# Patient Record
Sex: Male | Born: 1960
Health system: Southern US, Community
[De-identification: ages and names within clinical notes are randomized; demographics above are authoritative.]

## PROBLEM LIST (undated history)

## (undated) DIAGNOSIS — N400 Enlarged prostate without lower urinary tract symptoms: Secondary | ICD-10-CM

## (undated) DIAGNOSIS — F32A Depression, unspecified: Secondary | ICD-10-CM

## (undated) DIAGNOSIS — K635 Polyp of colon: Secondary | ICD-10-CM

## (undated) DIAGNOSIS — F329 Major depressive disorder, single episode, unspecified: Secondary | ICD-10-CM

## (undated) DIAGNOSIS — S72009A Fracture of unspecified part of neck of unspecified femur, initial encounter for closed fracture: Secondary | ICD-10-CM

## (undated) DIAGNOSIS — I739 Peripheral vascular disease, unspecified: Secondary | ICD-10-CM

## (undated) DIAGNOSIS — I10 Essential (primary) hypertension: Secondary | ICD-10-CM

## (undated) HISTORY — DX: Essential (primary) hypertension: I10

## (undated) HISTORY — PX: EYE SURGERY: SHX253

## (undated) HISTORY — DX: Polyp of colon: K63.5

## (undated) HISTORY — PX: OTHER SURGICAL HISTORY: SHX169

---
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
## 2013-02-24 DIAGNOSIS — K635 Polyp of colon: Secondary | ICD-10-CM

## 2013-02-24 HISTORY — DX: Polyp of colon: K63.5

## 2018-07-09 ENCOUNTER — Encounter: Payer: Self-pay | Admitting: Internal Medicine

## 2018-07-09 ENCOUNTER — Ambulatory Visit: Payer: Self-pay | Admitting: Internal Medicine

## 2018-07-09 VITALS — BP 168/88 | HR 86 | Resp 12 | Ht 67.0 in | Wt 144.0 lb

## 2018-07-09 DIAGNOSIS — F32A Depression, unspecified: Secondary | ICD-10-CM

## 2018-07-09 DIAGNOSIS — M79605 Pain in left leg: Secondary | ICD-10-CM

## 2018-07-09 DIAGNOSIS — M5442 Lumbago with sciatica, left side: Secondary | ICD-10-CM

## 2018-07-09 DIAGNOSIS — M79604 Pain in right leg: Secondary | ICD-10-CM

## 2018-07-09 DIAGNOSIS — B351 Tinea unguium: Secondary | ICD-10-CM

## 2018-07-09 DIAGNOSIS — R200 Anesthesia of skin: Secondary | ICD-10-CM

## 2018-07-09 DIAGNOSIS — N319 Neuromuscular dysfunction of bladder, unspecified: Secondary | ICD-10-CM

## 2018-07-09 DIAGNOSIS — M5441 Lumbago with sciatica, right side: Secondary | ICD-10-CM

## 2018-07-09 DIAGNOSIS — R030 Elevated blood-pressure reading, without diagnosis of hypertension: Secondary | ICD-10-CM

## 2018-07-09 DIAGNOSIS — F329 Major depressive disorder, single episode, unspecified: Secondary | ICD-10-CM

## 2018-07-09 DIAGNOSIS — G8929 Other chronic pain: Secondary | ICD-10-CM

## 2018-07-09 DIAGNOSIS — Z716 Tobacco abuse counseling: Secondary | ICD-10-CM

## 2018-07-09 DIAGNOSIS — L720 Epidermal cyst: Secondary | ICD-10-CM

## 2018-07-09 MED ORDER — BUPROPION HCL ER (XL) 150 MG PO TB24: ORAL_TABLET | ORAL | 2 refills | Status: DC

## 2018-07-09 NOTE — Patient Instructions (Signed)
Can google "advance directives, [place]"  And bring up form from Secretary of State. Print and fill out Or can go to "5 wishes"  Which is also in Spanish and fill out--this costs $5--perhaps easier to use. Designate a Medical Power of Attorney to speak for you if you are unable to speak for yourself when ill or injured  
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
## 2018-07-09 NOTE — Progress Notes (Signed)
Patient ID: Jaime [name], male   DOB: Jul 26, 1961, 57 y.o.   MRN: [id number]  Social work Intern, Delena Serve assessed patient for [place] symptoms and Social determinants of Health. Jaime [name] reported that he has lost interest in daily activities, and is feeling depressed. Patient also expressed that he has been feeling tired and is  having difficulties sleeping through the night. He reported having poor appetite daily. and has been feeling bad about himself. He also stated having difficulty concentrating on things and he has been moving slow for several days a week. Denied any suicidal thoughts.   Regarding SDOH, Patient disclosed that he was homeless for a period of time, but has housing at the moment. According to Jaime [name], all other needs for SDOH are met. SWI offered counseling, patient accepted, will commence/start counseling next week.

## 2018-07-16 ENCOUNTER — Ambulatory Visit: Payer: Self-pay | Admitting: Internal Medicine

## 2018-07-16 ENCOUNTER — Encounter: Payer: Self-pay | Admitting: Internal Medicine

## 2018-07-16 VITALS — BP 150/82 | HR 84 | Resp 12 | Ht 67.0 in | Wt 142.0 lb

## 2018-07-16 DIAGNOSIS — F32A Depression, unspecified: Secondary | ICD-10-CM | POA: Insufficient documentation

## 2018-07-16 DIAGNOSIS — F329 Major depressive disorder, single episode, unspecified: Secondary | ICD-10-CM | POA: Insufficient documentation

## 2018-07-16 DIAGNOSIS — N319 Neuromuscular dysfunction of bladder, unspecified: Secondary | ICD-10-CM | POA: Insufficient documentation

## 2018-07-16 DIAGNOSIS — M5441 Lumbago with sciatica, right side: Secondary | ICD-10-CM

## 2018-07-16 DIAGNOSIS — Z125 Encounter for screening for malignant neoplasm of prostate: Secondary | ICD-10-CM

## 2018-07-16 DIAGNOSIS — G8929 Other chronic pain: Secondary | ICD-10-CM | POA: Insufficient documentation

## 2018-07-16 DIAGNOSIS — R200 Anesthesia of skin: Secondary | ICD-10-CM

## 2018-07-16 DIAGNOSIS — M5442 Lumbago with sciatica, left side: Secondary | ICD-10-CM

## 2018-07-16 DIAGNOSIS — I10 Essential (primary) hypertension: Secondary | ICD-10-CM

## 2018-07-16 DIAGNOSIS — M79605 Pain in left leg: Secondary | ICD-10-CM

## 2018-07-16 DIAGNOSIS — R030 Elevated blood-pressure reading, without diagnosis of hypertension: Secondary | ICD-10-CM | POA: Insufficient documentation

## 2018-07-16 DIAGNOSIS — Z79899 Other long term (current) drug therapy: Secondary | ICD-10-CM

## 2018-07-16 DIAGNOSIS — Z13220 Encounter for screening for lipoid disorders: Secondary | ICD-10-CM

## 2018-07-16 DIAGNOSIS — M79604 Pain in right leg: Secondary | ICD-10-CM | POA: Insufficient documentation

## 2018-07-16 DIAGNOSIS — Z716 Tobacco abuse counseling: Secondary | ICD-10-CM | POA: Insufficient documentation

## 2018-07-16 MED ORDER — LISINOPRIL 5 MG PO TABS: 5.0000 mg | ORAL_TABLET | Freq: Every day | ORAL | 11 refills | Status: DC

## 2018-07-16 NOTE — Progress Notes (Signed)
   Subjective:    Patient ID: Jaime [name], male    DOB: Feb 09, 1961, 57 y.o.   MRN: [id number]  HPI   1.  Depression:  Has an appt with Lance Coon, SW intern this Friday.  Unable to get his bupropion last week, so not following up today to see how doing on that medication.  2.  Tobacco:  Has decreased smoking to 1 pack every 5 days--before would go through a third of a pack daily.  Only taking a couple of puffs of each cigarette.  3.  Elevated BP:  Not feeling anxious today  No outpatient medications have been marked as taking for the 07/16/18 encounter (Office Visit) with Julieanne Manson, MD.    No Known Allergies  Review of Systems     Objective:   Physical Exam NAD Lungs:  CTA CV: RRR without murmur or rub.  Radial and DP pulses normal and equal        Assessment & Plan:  1.  Depression:  Encouraged to notify us if he is unable to perform treatments expected for follow up in future.  We understand difficult situations, but need him to be up front about what he can and cannot do.  2.  Hypertension:  BP remains elevated.  Start Lisinopril 5 mg daily.  He will not be able to pick up until Oct 1 when he receives a check.  Followup 1 week later.  3.  Left groin cyst and toenail onychomycosis:  Discussed needs to wait on these benign conditions until we get him evaluated and treated for his leg/back/urinary/bp/depression issues.  4.  Fasting labs today:  FLP, CBC, CMP, PSA

## 2018-07-17 LAB — CBC WITH DIFFERENTIAL/PLATELET
Basophils Absolute: 0 x10E3/uL (ref 0.0–0.2)
Basos: 1 %
EOS (ABSOLUTE): 0.1 x10E3/uL (ref 0.0–0.4)
Eos: 1 %
Hematocrit: 41.2 % (ref 37.5–51.0)
Hemoglobin: 13.9 g/dL (ref 13.0–17.7)
Immature Grans (Abs): 0 x10E3/uL (ref 0.0–0.1)
Immature Granulocytes: 0 %
Lymphocytes Absolute: 2.3 x10E3/uL (ref 0.7–3.1)
Lymphs: 38 %
MCH: 32 pg (ref 26.6–33.0)
MCHC: 33.7 g/dL (ref 31.5–35.7)
MCV: 95 fL (ref 79–97)
Monocytes Absolute: 0.7 x10E3/uL (ref 0.1–0.9)
Monocytes: 11 %
Neutrophils Absolute: 3 x10E3/uL (ref 1.4–7.0)
Neutrophils: 49 %
Platelets: 300 x10E3/uL (ref 150–450)
RBC: 4.35 x10E6/uL (ref 4.14–5.80)
RDW: 11.7 % — ABNORMAL LOW (ref 12.3–15.4)
WBC: 6.1 x10E3/uL (ref 3.4–10.8)

## 2018-07-17 LAB — COMPREHENSIVE METABOLIC PANEL WITH GFR
ALT: 53 IU/L — ABNORMAL HIGH (ref 0–44)
AST: 39 IU/L (ref 0–40)
Albumin/Globulin Ratio: 1.3 (ref 1.2–2.2)
Albumin: 4.2 g/dL (ref 3.5–5.5)
Alkaline Phosphatase: 107 IU/L (ref 39–117)
BUN/Creatinine Ratio: 7 — ABNORMAL LOW (ref 9–20)
BUN: 5 mg/dL — ABNORMAL LOW (ref 6–24)
Bilirubin Total: 0.5 mg/dL (ref 0.0–1.2)
CO2: 22 mmol/L (ref 20–29)
Calcium: 9.7 mg/dL (ref 8.7–10.2)
Chloride: 93 mmol/L — ABNORMAL LOW (ref 96–106)
Creatinine, Ser: 0.75 mg/dL — ABNORMAL LOW (ref 0.76–1.27)
GFR calc Af Amer: 119 mL/min/1.73 (ref >59)
GFR calc non Af Amer: 103 mL/min/1.73 (ref >59)
Globulin, Total: 3.3 g/dL (ref 1.5–4.5)
Glucose: 99 mg/dL (ref 65–99)
Potassium: 5.2 mmol/L (ref 3.5–5.2)
Sodium: 133 mmol/L — ABNORMAL LOW (ref 134–144)
Total Protein: 7.5 g/dL (ref 6.0–8.5)

## 2018-07-17 LAB — LIPID PANEL W/O CHOL/HDL RATIO
Cholesterol, Total: 146 mg/dL (ref 100–199)
HDL: 61 mg/dL (ref >39)
LDL Calculated: 75 mg/dL (ref 0–99)
Triglycerides: 51 mg/dL (ref 0–149)
VLDL Cholesterol Cal: 10 mg/dL (ref 5–40)

## 2018-07-17 LAB — PSA: Prostate Specific Ag, Serum: 1.4 ng/mL (ref 0.0–4.0)

## 2018-07-18 ENCOUNTER — Ambulatory Visit: Payer: Self-pay | Admitting: Licensed Clinical Social Worker

## 2018-07-18 DIAGNOSIS — F32A Depression, unspecified: Secondary | ICD-10-CM

## 2018-07-18 DIAGNOSIS — F329 Major depressive disorder, single episode, unspecified: Secondary | ICD-10-CM
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
## 2018-07-25 ENCOUNTER — Other Ambulatory Visit: Payer: Self-pay | Admitting: Licensed Clinical Social Worker

## 2018-08-05 ENCOUNTER — Encounter: Payer: Self-pay | Admitting: Internal Medicine

## 2018-08-05 ENCOUNTER — Ambulatory Visit: Payer: Self-pay | Admitting: Internal Medicine

## 2018-08-05 VITALS — BP 132/72 | HR 84 | Resp 12 | Ht 67.0 in | Wt 141.0 lb

## 2018-08-05 DIAGNOSIS — F32A Depression, unspecified: Secondary | ICD-10-CM

## 2018-08-05 DIAGNOSIS — M79605 Pain in left leg: Secondary | ICD-10-CM

## 2018-08-05 DIAGNOSIS — I10 Essential (primary) hypertension: Secondary | ICD-10-CM

## 2018-08-05 DIAGNOSIS — F329 Major depressive disorder, single episode, unspecified: Secondary | ICD-10-CM

## 2018-08-05 DIAGNOSIS — N319 Neuromuscular dysfunction of bladder, unspecified: Secondary | ICD-10-CM

## 2018-08-05 DIAGNOSIS — M79604 Pain in right leg: Secondary | ICD-10-CM

## 2018-08-05 NOTE — Progress Notes (Signed)
   Subjective:    Patient ID: Jaime [name], male    DOB: 07-07-1961, 57 y.o.   MRN: [id number]  HPI   1.  Hypertension: much improved with low dose Lisinopril.  Has a mild dry cough now.  He only has it rarely.  2. Tobacco abuse:  Down to 1 pack per week.  3.  Depression:  Has been on Bupropion for 2 weeks.  Interested in life now.  Enjoying cooking.   Still waking throughout the night.  Able to get back to sleep, but not quickly. Not eating better.  Still just not hungry--nibbles.  Not feeling down like he was previously.   Not doing much physically active due to leg discomfort.     4.  Elevated ALT:  Discussed will recheck in 2 months and if still elevated, will pursue work up.  Does not recall Hepatitis testing for Hep B and C in prison.  He does recall HIV testing that was normal.    5.  Leg and urinary issues:  Have not heard anything yet for urology, MRI, vascular studies.  Looking into when those might happen.  He is not interested in pursuing through [employer] as he is concerned about the ultimate cost.    Current Meds  Medication Sig  . buPROPion (WELLBUTRIN XL) 150 MG 24 hr tablet 1 tab by mouth every morning  . lisinopril (PRINIVIL,ZESTRIL) 5 MG tablet Take 1 tablet (5 mg total) by mouth daily.   No Known Allergies   Review of Systems     Objective:   Physical Exam NAD Smells strongly of cigarette smoke. Moves easily to exam table Lungs:  Scattered wheezes that resolve with deep breathing.  Good air movement CV:  RRR without murmur or rub.  Radial pulses normal and equal.  Mildly decreased DP pulses bilaterally Abd:  S, NT save over suprapubic area--feels need to urinate with pressure here.  No HSM or mass, + BS LE:  No edema.       Assessment & Plan:  1.  Hypertension:  Controlled on Lisinopril.  Not sure if cough related to ACE I.  To call if worsens.  2.  Depression:  Seems to have definite improvement.  CPM and follow up in 1 month.  3.   Tobacco abuse:  Encouraged to pick a day and stop.  Gave information on Quitline to get nicotine gum or lozenges to use instead of his 2 cigarette a day usage.  Bupropion suspect helps as well.  4.  Leg and urinary complaints:  We should hear from [facility] regarding Urology and MRI appts soon.  Checking to see if vascular dopple/ABI of LE can be done through [facility] as well.  If not, will hold on ordering until we see results of MRI.  If no etiology found there, then proceed with the vascular testing. Discussed he really needs to stop smoking.

## 2018-08-05 NOTE — Patient Instructions (Signed)
Free flu vaccine/covered flu vaccine (bring insurance card if you have one) at flu vaccine clinics:   [facility] card sign up in October 17th for 2 hours  between 8:30 a.m. and 11:30 a.m .  Call if you urinary symptoms or leg pain progresses.

## 2018-08-07 ENCOUNTER — Other Ambulatory Visit (INDEPENDENT_AMBULATORY_CARE_PROVIDER_SITE_OTHER): Payer: Self-pay

## 2018-08-07 DIAGNOSIS — R319 Hematuria, unspecified: Secondary | ICD-10-CM

## 2018-08-07 LAB — POCT URINALYSIS DIPSTICK
Bilirubin, UA: NEGATIVE
Glucose, UA: NEGATIVE
Ketones, UA: NEGATIVE
Nitrite, UA: NEGATIVE
Protein, UA: NEGATIVE
Spec Grav, UA: <=1.005 — AB
Urobilinogen, UA: 0.2 U/dL
pH, UA: 5

## 2018-08-09 LAB — URINE CULTURE: Organism ID, Bacteria: NO GROWTH

## 2018-09-02 ENCOUNTER — Ambulatory Visit: Payer: Self-pay | Admitting: Internal Medicine

## 2018-09-22 ENCOUNTER — Ambulatory Visit: Payer: Self-pay | Admitting: Internal Medicine

## 2018-09-22 ENCOUNTER — Encounter: Payer: Self-pay | Admitting: Internal Medicine

## 2018-09-22 VITALS — BP 140/88 | HR 94 | Resp 12 | Ht 67.0 in | Wt 148.0 lb

## 2018-09-22 DIAGNOSIS — F32A Depression, unspecified: Secondary | ICD-10-CM

## 2018-09-22 DIAGNOSIS — F329 Major depressive disorder, single episode, unspecified: Secondary | ICD-10-CM

## 2018-09-22 DIAGNOSIS — R7401 Elevation of levels of liver transaminase levels: Secondary | ICD-10-CM

## 2018-09-22 DIAGNOSIS — M79604 Pain in right leg: Secondary | ICD-10-CM

## 2018-09-22 DIAGNOSIS — N319 Neuromuscular dysfunction of bladder, unspecified: Secondary | ICD-10-CM

## 2018-09-22 DIAGNOSIS — M79605 Pain in left leg: Secondary | ICD-10-CM

## 2018-09-22 DIAGNOSIS — Z23 Encounter for immunization: Secondary | ICD-10-CM

## 2018-09-22 DIAGNOSIS — I10 Essential (primary) hypertension: Secondary | ICD-10-CM

## 2018-09-22 DIAGNOSIS — R740 Nonspecific elevation of levels of transaminase and lactic acid dehydrogenase [LDH]: Secondary | ICD-10-CM

## 2018-09-22 DIAGNOSIS — Z716 Tobacco abuse counseling: Secondary | ICD-10-CM

## 2018-09-22 MED ORDER — BUPROPION HCL ER (XL) 150 MG PO TB24: ORAL_TABLET | ORAL | 11 refills | Status: AC

## 2018-09-22 MED ORDER — TERBINAFINE HCL 250 MG PO TABS: 250.0000 mg | ORAL_TABLET | Freq: Every day | ORAL | 0 refills | Status: DC

## 2018-09-22 MED ORDER — NICOTINE POLACRILEX 2 MG MT LOZG: 2.0000 mg | LOZENGE | OROMUCOSAL | 2 refills | Status: DC | PRN

## 2018-09-22 NOTE — Progress Notes (Signed)
Subjective:    Patient ID: Jaime [name], male    DOB: [birth year], 57 y.o.   MRN: [id number]  HPI  1.  Leg numbness, particularly in posterior lower legs. MR recently of lower thoracic to sacral spine essentially normal.  No lesion to suggest cause.  With his symptoms, seems more clearly claudication as a separate problem from what seems to be a neurogenic bladder, rather than related neurologic issue. He is still waiting for the arterial studies/ABIs of legs through [facility].   Not taking ASA He continues to smoke. States down to 2-3 cigarettes daily.   He did call the quit line, but they never sent the nicotine lozenges. He called back and they told him he did not qualify for them.    2.  Neurogenic bladder:  Again, he has to sit and apply pressure to bladder area to urinate.  This seemed to come on suddenly about 1 year ago.  He does not remember a gradual development of poor urine stream or urinary frequency or nocturia.  Just suddenly developed inability to urinate without above procedure. MR showed very distended bladder without mention of prostate.  Prostate exam in September was normal.   Able to contact Dr. Yvone Neu, reading radiologist for this study later:  MR did not go down to prostate level.  3.  Depression:  Feels he is doing better.  Getting involved in things that interest him again.  Not flying off the handle as he was.   Appetite is better, cooking for his roommate Has tried to be physically active, but limited with leg pain. Continues on Bupropion XL 150 mg daily  Tolerating well.  4.  Elevated ALT:  Was a bit high in September.  Due for recheck  5.  Hypertension:  Continues on low dose Lisinopril.  No problems.  Drinking up to a 6 pack a day over the weekend and 2 beers daily during week.    6. Toenail onychomycosis/tinea pedis:  Need to recheck ALT before starting Terbinafine.     Current Meds  Medication Sig  . buPROPion (WELLBUTRIN XL) 150 MG 24 hr tablet 1 tab by mouth  every morning  . lisinopril (PRINIVIL,ZESTRIL) 5 MG tablet Take 1 tablet (5 mg total) by mouth daily.   No Known Allergies Review of Systems     Objective:   Physical Exam NAD HEENT:  PERRL, EOMI, throat without injection Neck:  Supple, No adenopathy Chest:  CTA CV:  RRR without murmur or rub.  Radial pulses normal and equal Abd:  S, NT, No HSM or mass, + BS LE:  Somewhat decreased femoral pulses bilaterally. Decreased tibial pulses in popliteal fossae bilaterally. Unable to palpate PT pulses, but does have decreased DP pulses bilaterally. Feet:  Thickened flaking skin about nails.  Toenails, especially great toenails with thickening and discoloration.       Assessment & Plan:  1.  Leg numbness:  This is likely claudication symptomatology.  Has not had his vascular studies yet through orange card.  Checking in with them to see when this might be done or if should proceed through [facility] and apply for financial aid. Needs to quit smoking. Rx for nicotine lozenges to use as needed.  2.  Neurogenic bladder:  MR did not show the prostate.  Has urology appt on the 5th.  Will send MR results.  3.  Hypertension:  Fair control.  No change to dosing of Lisinopril for now.  4.  Depression:  Much improved.  Continue bupropion for 1 year.  5.  Elevated ALT:  Encouraged decreased beer intake, though would expect AST to be higher if this is the cause. Recheck hepatic profile today.  6. Toenail onychomycosis:  Await hepatic profile.  If ALT normalized, start Terbinafine 250 mg daily for 84 day.  Discussed shoe and shower floor care.    7.  HM:  Influenza vaccine.

## 2018-09-23 LAB — HEPATIC FUNCTION PANEL
ALT: 26 IU/L (ref 0–44)
AST: 32 IU/L (ref 0–40)
Albumin: 4.4 g/dL (ref 3.5–5.5)
Alkaline Phosphatase: 83 IU/L (ref 39–117)
Bilirubin Total: 0.3 mg/dL (ref 0.0–1.2)
Bilirubin, Direct: 0.12 mg/dL (ref 0.00–0.40)
Total Protein: 7.9 g/dL (ref 6.0–8.5)

## 2018-10-17 ENCOUNTER — Other Ambulatory Visit: Payer: Self-pay

## 2018-10-17 DIAGNOSIS — Z125 Encounter for screening for malignant neoplasm of prostate: Secondary | ICD-10-CM

## 2018-10-18 LAB — PSA: Prostate Specific Ag, Serum: 1.2 ng/mL (ref 0.0–4.0)

## 2018-10-30 ENCOUNTER — Other Ambulatory Visit (HOSPITAL_COMMUNITY): Payer: Self-pay | Admitting: Urology

## 2018-10-30 DIAGNOSIS — R3121 Asymptomatic microscopic hematuria: Secondary | ICD-10-CM

## 2018-11-03 ENCOUNTER — Ambulatory Visit: Payer: Self-pay | Admitting: Internal Medicine

## 2018-11-03 ENCOUNTER — Telehealth: Payer: Self-pay | Admitting: Internal Medicine

## 2018-11-03 NOTE — Telephone Encounter (Signed)
Patient is scheduled for diagnostic for kidney and bladder.  Patient was told he was being checked for Prostate Cancer.  Patient is scheduled  for November 11, 2018 for CAT Scan and blood work at [facility].  Patient is upset and would like doctor to call him.  Patient can be called at [phone].

## 2018-11-07 NOTE — Telephone Encounter (Signed)
Spoke with patient. States they are going to do a CT Scan. Patient states he is nervous because he has never had these issues before. Patient states he wants Dr. Delrae Alfred to stay informed on all the decisions that Dr. Alvester Morin is making. Per Dr. Delrae Alfred patient is going through routine checks to rule out if there is any major issues. This is a normal process. Informed patient we would get the records and call him back if we have questions. Patient verbalized understanding.

## 2018-11-11 ENCOUNTER — Ambulatory Visit (HOSPITAL_COMMUNITY)
Admission: RE | Admit: 2018-11-11 | Discharge: 2018-11-11 | Disposition: A | Discharge status: Home / Self Care | Payer: Medicaid Other | Source: Ambulatory Visit | Attending: Urology | Admitting: Urology

## 2018-11-11 ENCOUNTER — Encounter (HOSPITAL_COMMUNITY): Payer: Self-pay

## 2018-11-11 ENCOUNTER — Other Ambulatory Visit (HOSPITAL_COMMUNITY)
Admit: 2018-11-11 | Discharge: 2018-11-11 | Disposition: A | Discharge status: Home / Self Care | Payer: Medicaid Other | Source: Ambulatory Visit | Attending: Urology | Admitting: Urology

## 2018-11-11 DIAGNOSIS — R3121 Asymptomatic microscopic hematuria: Secondary | ICD-10-CM | POA: Insufficient documentation

## 2018-11-11 LAB — CREATININE, SERUM
Creatinine, Ser: 0.79 mg/dL (ref 0.61–1.24)
GFR calc Af Amer: >60 mL/min (ref >60)
GFR calc non Af Amer: >60 mL/min (ref >60)

## 2018-11-11 LAB — BUN: BUN: 7 mg/dL (ref 6–20)

## 2018-11-11 MED ORDER — IOHEXOL 300 MG/ML  SOLN
100.0000 mL | Freq: Once | INTRAMUSCULAR | Status: AC | PRN
  Administered 2018-11-11: 100 mL via INTRAVENOUS

## 2018-11-11 MED ORDER — SODIUM CHLORIDE (PF) 0.9 % IJ SOLN
INTRAMUSCULAR | Status: AC
  Filled 2018-11-11: qty 50

## 2018-11-11 MED ORDER — SODIUM CHLORIDE 0.9 % IV SOLN
INTRAVENOUS | Status: AC
  Filled 2018-11-11: qty 250

## 2018-11-22 ENCOUNTER — Encounter: Payer: Self-pay | Admitting: Internal Medicine

## 2018-11-22 DIAGNOSIS — I10 Essential (primary) hypertension: Secondary | ICD-10-CM | POA: Insufficient documentation

## 2018-12-08 ENCOUNTER — Encounter: Payer: Self-pay | Admitting: Internal Medicine

## 2018-12-08 ENCOUNTER — Ambulatory Visit: Payer: Medicaid Other | Admitting: Internal Medicine

## 2018-12-08 VITALS — BP 152/80 | HR 94 | Resp 12 | Ht 67.0 in | Wt 146.0 lb

## 2018-12-08 DIAGNOSIS — Z716 Tobacco abuse counseling: Secondary | ICD-10-CM | POA: Diagnosis not present

## 2018-12-08 DIAGNOSIS — M79604 Pain in right leg: Secondary | ICD-10-CM

## 2018-12-08 DIAGNOSIS — Z79899 Other long term (current) drug therapy: Secondary | ICD-10-CM | POA: Diagnosis not present

## 2018-12-08 DIAGNOSIS — B351 Tinea unguium: Secondary | ICD-10-CM

## 2018-12-08 DIAGNOSIS — R200 Anesthesia of skin: Secondary | ICD-10-CM | POA: Diagnosis not present

## 2018-12-08 DIAGNOSIS — M79605 Pain in left leg: Secondary | ICD-10-CM

## 2018-12-08 MED ORDER — NICOTINE POLACRILEX 2 MG MT LOZG: 2.0000 mg | LOZENGE | OROMUCOSAL | 2 refills | Status: DC | PRN

## 2018-12-08 NOTE — Progress Notes (Signed)
    Subjective:    Patient ID: Jaime [name], male   DOB: [birth year], 58 y.o.   MRN: [id number]   HPI   1.  Underwent urologic work up and felt to have BPH as cause of urinary retention.  Feels his symptoms are better since starting Flomax.  2.  Still with low back pain and feet getting cold and numb now for past 4 weeks or so.  States the coldness and numbness extends to tips of all toes over dorsal foot.  Plantar foot not involved. He has taken his socks off to look at toes when has these symptoms and notes no color change MRI of lumbosacral spine without significant abnormality to suggest etiology of discomfort of back and LE originates there.  We were waiting for ABIs to evaluate for claudication, but had not received through [facility] in October.  He has the foot and leg complaints with walking, but more symptomatic when trying to lie down and relax.  Later, states if pushes self too much with walking, has more pain symptoms.  He now has Medicaid and so we can proceed with that order. Smokes maybe 2-3 cigarettes weekly. He has not obtained the Nicotine lozenges.   He has tried to obtain through the Quit line previously.    3.  Toenail fungus:  Cannot say where he is with Terbinafine.  Not clear what happened that he did not follow up at week 6.  States his toenails are clearing.     Current Meds  Medication Sig  . buPROPion (WELLBUTRIN XL) 150 MG 24 hr tablet 1 tab by mouth every morning  . lisinopril (PRINIVIL,ZESTRIL) 5 MG tablet Take 1 tablet (5 mg total) by mouth daily.  . tamsulosin (FLOMAX) 0.4 MG CAPS capsule Take 0.4 mg by mouth daily after supper.  . terbinafine (LAMISIL) 250 MG tablet Take 1 tablet (250 mg total) by mouth daily.   No Known Allergies   Review of Systems    Objective:   BP (!) 152/80 (BP Location: Left Arm, Patient Position: Sitting, Cuff Size: Normal)   Pulse 94   Resp 12   Ht 5\' 7"  (1.702 m)   Wt 146 lb (66.2 kg)   BMI 22.87 kg/m   Physical Exam   Smells of tobacco smoke mildly Lungs:  CTA CV:  RRR without murmur or rub.  Decreased DP and PT pulses  Feet with thickened horn like toenails, but with clearing at bases.   Assessment & Plan   1.  BPH with urinary retention:  As per Urology. Flomax helping  2.  Possible claudication with LE pain bilaterally.  MRI of L/S spine did not support L/S spine or nerve impingement as etiology.   Set up for evaluation of LE arteries.  3.  Toenail onychomycosis:  Appears to be responding to Terbinafine.  Continue to treat shower floors and shoes.  Hepatic profile.    4.  Tobacco use:  With concern more so for claudication symptoms, encouraged obtaining the nicotine lozenges and get off cigarettes/tobacco completely.

## 2018-12-09 LAB — HEPATIC FUNCTION PANEL
ALT: 20 IU/L (ref 0–44)
AST: 18 IU/L (ref 0–40)
Albumin: 4.4 g/dL (ref 3.8–4.9)
Alkaline Phosphatase: 89 IU/L (ref 39–117)
Bilirubin Total: 0.4 mg/dL (ref 0.0–1.2)
Bilirubin, Direct: 0.13 mg/dL (ref 0.00–0.40)
Total Protein: 7.8 g/dL (ref 6.0–8.5)

## 2018-12-22 ENCOUNTER — Other Ambulatory Visit: Payer: Self-pay | Admitting: Internal Medicine

## 2018-12-22 ENCOUNTER — Ambulatory Visit: Payer: Self-pay | Admitting: Internal Medicine

## 2018-12-30 ENCOUNTER — Ambulatory Visit: Payer: Self-pay | Admitting: Internal Medicine

## 2019-02-06 ENCOUNTER — Telehealth: Payer: Self-pay | Admitting: Internal Medicine

## 2019-02-06 NOTE — Telephone Encounter (Signed)
Rash on torso, neck, axillae areas.  Gets when hot out.   Has been intermittently using topical Lamisil and seems to take care of it for a while and then recurs. White spots on skin.  No definite flaking.  Does not want to come in. Discussed likely Tinea versicolor. If Lamisil seems to help, to use twice daily for 14 days at least and until lesions gone.  Also, may treat topically with Selsun Blue shampoo on affected areas--slather on moist skin for 10 minutes, then wash off 3 times weekly.  To call for appt to cover his chronic issues in a month or so.

## 2019-02-09 ENCOUNTER — Telehealth: Payer: Medicaid Other | Admitting: Internal Medicine

## 2019-02-24 ENCOUNTER — Encounter: Payer: Self-pay | Admitting: Internal Medicine

## 2019-02-24 DIAGNOSIS — K635 Polyp of colon: Secondary | ICD-10-CM

## 2019-03-20 ENCOUNTER — Telehealth: Payer: Self-pay

## 2019-03-20 NOTE — Telephone Encounter (Signed)
Patient called asking for a letter from Dr. Delrae Alfred. Patient apartment complex will allow him to have a dog if his doctor writes a note stating its for therapy. Patient states he found a dog and would like to get it tomorrow. State he needs the dog it helps to keep him calm.  To Dr. Delrae Alfred to write letter

## 2019-03-20 NOTE — Telephone Encounter (Signed)
Note written

## 2019-07-10 ENCOUNTER — Telehealth (HOSPITAL_COMMUNITY): Payer: Self-pay | Admitting: Rehabilitation

## 2019-07-10 ENCOUNTER — Other Ambulatory Visit: Payer: Self-pay

## 2019-07-10 DIAGNOSIS — M79605 Pain in left leg: Secondary | ICD-10-CM

## 2019-07-10 DIAGNOSIS — M79604 Pain in right leg: Secondary | ICD-10-CM

## 2019-07-10 DIAGNOSIS — R200 Anesthesia of skin: Secondary | ICD-10-CM

## 2019-07-10 NOTE — Telephone Encounter (Signed)
I attempted to contact this patient to schedule an ultrasound ordered by Dr. Amil Amen. The patient did not answer. I left the patient a voicemail on his mobile number requesting a call back to get this scheduled.

## 2019-07-15 ENCOUNTER — Other Ambulatory Visit: Payer: Self-pay | Admitting: Internal Medicine

## 2019-07-15 ENCOUNTER — Ambulatory Visit (HOSPITAL_COMMUNITY)
Admission: RE | Admit: 2019-07-15 | Discharge: 2019-07-15 | Disposition: A | Discharge status: Home / Self Care | Payer: Medicaid Other | Source: Ambulatory Visit | Attending: Family | Admitting: Family

## 2019-07-15 ENCOUNTER — Other Ambulatory Visit: Payer: Self-pay

## 2019-07-15 DIAGNOSIS — I739 Peripheral vascular disease, unspecified: Secondary | ICD-10-CM

## 2019-07-15 DIAGNOSIS — M79605 Pain in left leg: Secondary | ICD-10-CM | POA: Diagnosis not present

## 2019-07-15 DIAGNOSIS — M79604 Pain in right leg: Secondary | ICD-10-CM | POA: Insufficient documentation

## 2019-07-15 NOTE — Progress Notes (Signed)
Exam noted today with critical findings. Needs [facility] referral ASAP--called to clinic and message left with after hours answering service.

## 2019-07-15 NOTE — Progress Notes (Signed)
Performed bilateral ABI with TBI. Attempted to leave preliminary results with Dr. Amil Amen, left voicemail. Consulted with Dr. Scot Dock about preliminary results. Patient instructed to return home.

## 2019-07-20 ENCOUNTER — Telehealth: Payer: Self-pay | Admitting: Internal Medicine

## 2019-07-20 NOTE — Telephone Encounter (Signed)
Patient called requesting a call back from [place] to update her with new information regarding appointment that has been scheduled with a provider that she needs to be aware of. Patient did not want to provide more information when was asked the reason of the call. Patient's phone number [phone]  To Cherice to contact patient.

## 2019-07-21 NOTE — Telephone Encounter (Signed)
Spoke with patient. Appointment scheduled for 08/12/2019. Patient states he will try to hold out until then. States legs and feet hurt really bad. Spoke with Dr. Amil Amen regarding this and she said to hold on before he goes to the ER. States she will try and see if there is something she can start patient on to help with leg pain. States patient has to stop smoking. Message given to patient. States he has lozenges to help with smoking he has not started but will try them. Will wait to hear back from our office.  To Dr. Amil Amen for further directions.

## 2019-07-30 ENCOUNTER — Telehealth (INDEPENDENT_AMBULATORY_CARE_PROVIDER_SITE_OTHER): Payer: Medicaid Other | Admitting: Internal Medicine

## 2019-07-30 DIAGNOSIS — I10 Essential (primary) hypertension: Secondary | ICD-10-CM

## 2019-07-30 DIAGNOSIS — K029 Dental caries, unspecified: Secondary | ICD-10-CM | POA: Insufficient documentation

## 2019-07-30 DIAGNOSIS — I739 Peripheral vascular disease, unspecified: Secondary | ICD-10-CM | POA: Diagnosis not present

## 2019-07-30 MED ORDER — ASPIRIN EC 81 MG PO TBEC: 81.0000 mg | DELAYED_RELEASE_TABLET | Freq: Every day | ORAL | Status: AC

## 2019-07-30 NOTE — Patient Instructions (Signed)
Stop smoking.   Make goal to stop until intervention performed for arteries of legs and then continue with no smoking thereafter  Stop alcohol until intervention for legs.

## 2019-07-30 NOTE — Progress Notes (Signed)
Done

## 2019-07-30 NOTE — Progress Notes (Signed)
    Subjective:    Patient ID: Jaime [name], male   DOB: 24-Oct-1960, 58 y.o.   MRN: [id number]   HPI   1.  PAD:  Critical PAD of bilateral legs.  Patient with long history of bilateral leg pain and numbness.  Previously felt associated with low back pain, but radiologic evaluation of L/S spine without significant abnormality. Recent vascular evaluation of legs showed the critical disease. Has appt at [facility] on 08/12/2019 States his pain is doing a bit better. Has found not elevating legs above heart helps with sleep.  Toes are chronically numb as are dorsal feet, but no discoloration or breakdown of tissue of toes or feet.   Pain mainly with walking only. Continues to smoke about 5 cigarettes weekly. Does not want to use the nicotine lozenges he has --afraid of what they can do to his oral tissue. He is also drinking a 40 oz of malt liquor every other day. Not taking an ASA daily. Has not had BP checked since OV in February.   No showed appt in April.  2.  Dental:  Has 3 cavities after eval with dental clinic.   Has appt with them tomorrow.  Being evaluated for dental surgery that will lead to partial.  Waiting on approval for dental surgery. He will notify dentist of likely need for peripheral arterial procedure/surgery in near future.  3.  Hypertension:  Has not had bp checked in some time.  Continues on Lisinopril  4.  Tobacco:  Is taking Bupropion, but has not stopped tobacco. Again, not using nicotine lozenges either.  5.  Onychomycosis:  Toenails doing well since treatment with Terbinafine.  6.  BPH with urinary retention: improved with Tamsulosin.  Current Meds  Medication Sig  . buPROPion (WELLBUTRIN XL) 150 MG 24 hr tablet 1 tab by mouth every morning  . lisinopril (PRINIVIL,ZESTRIL) 5 MG tablet Take 1 tablet (5 mg total) by mouth daily.  . tamsulosin (FLOMAX) 0.4 MG CAPS capsule Take 0.4 mg by mouth daily after supper.   No Known Allergies   Review of Systems     Objective:   There were no vitals taken for this visit.  Physical Exam   Assessment & Plan   1.  PAD Stop smoking.   Encouraged him to use lozenges as less of a concern than the cigarettes. Goal to stop at least until LE procedure takes place. Start ASA 81 mg daily. Holding on Clopidigrel until seen by Vascular surgeon this close to visit. To call or go to ED if sudden increase in pain/discoloration. He will come in for fasting labs next week with bp check, including FLP, though was good last year.  2.  Dental:  To inform dental clinic of vascular disease and recommendation to get vascular issues addressed first.  3.  Hypertension:  bp check next week with fasting labs.  4.  BPH:  Controlled with Tamsulosin.  5.  Tobacco and alcohol: goal to hold both for now as above.

## 2019-07-31 ENCOUNTER — Telehealth: Payer: Self-pay

## 2019-07-31 NOTE — Telephone Encounter (Signed)
Patient roommate called asking for BP monitor prescription to be sent to the pharmacy. States she called medicaid and they will cover a BP monitor for him.  To Dr. Amil Amen for further directions.

## 2019-08-04 ENCOUNTER — Other Ambulatory Visit: Payer: Medicaid Other

## 2019-08-04 ENCOUNTER — Other Ambulatory Visit: Payer: Self-pay

## 2019-08-05 ENCOUNTER — Other Ambulatory Visit (INDEPENDENT_AMBULATORY_CARE_PROVIDER_SITE_OTHER): Payer: Medicaid Other

## 2019-08-05 VITALS — BP 136/78 | HR 88

## 2019-08-05 DIAGNOSIS — Z79899 Other long term (current) drug therapy: Secondary | ICD-10-CM

## 2019-08-05 DIAGNOSIS — I10 Essential (primary) hypertension: Secondary | ICD-10-CM

## 2019-08-05 DIAGNOSIS — Z13220 Encounter for screening for lipoid disorders: Secondary | ICD-10-CM | POA: Diagnosis not present

## 2019-08-09 LAB — COMPREHENSIVE METABOLIC PANEL
ALT: 18 IU/L (ref 0–44)
AST: 25 IU/L (ref 0–40)
Albumin/Globulin Ratio: 1.3 (ref 1.2–2.2)
Albumin: 4.1 g/dL (ref 3.8–4.9)
Alkaline Phosphatase: 122 IU/L — ABNORMAL HIGH (ref 39–117)
BUN/Creatinine Ratio: 4 — ABNORMAL LOW (ref 9–20)
BUN: 3 mg/dL — ABNORMAL LOW (ref 6–24)
Bilirubin Total: 0.3 mg/dL (ref 0.0–1.2)
CO2: 19 mmol/L — ABNORMAL LOW (ref 20–29)
Calcium: 9.7 mg/dL (ref 8.7–10.2)
Chloride: 101 mmol/L (ref 96–106)
Creatinine, Ser: 0.77 mg/dL (ref 0.76–1.27)
GFR calc Af Amer: 116 mL/min/{1.73_m2} (ref >59)
GFR calc non Af Amer: 101 mL/min/{1.73_m2} (ref >59)
Globulin, Total: 3.2 g/dL (ref 1.5–4.5)
Glucose: 108 mg/dL — ABNORMAL HIGH (ref 65–99)
Potassium: 4.8 mmol/L (ref 3.5–5.2)
Sodium: 139 mmol/L (ref 134–144)
Total Protein: 7.3 g/dL (ref 6.0–8.5)

## 2019-08-09 LAB — LIPID PANEL W/O CHOL/HDL RATIO
Cholesterol, Total: 149 mg/dL (ref 100–199)
HDL: 64 mg/dL (ref >39)
LDL Chol Calc (NIH): 74 mg/dL (ref 0–99)
Triglycerides: 48 mg/dL (ref 0–149)
VLDL Cholesterol Cal: 11 mg/dL (ref 5–40)

## 2019-08-09 LAB — CBC WITH DIFFERENTIAL/PLATELET
Basophils Absolute: 0 10*3/uL (ref 0.0–0.2)
Basos: 0 %
EOS (ABSOLUTE): 0.1 10*3/uL (ref 0.0–0.4)
Eos: 1 %
Hematocrit: 44.1 % (ref 37.5–51.0)
Hemoglobin: 14.8 g/dL (ref 13.0–17.7)
Immature Grans (Abs): 0 10*3/uL (ref 0.0–0.1)
Immature Granulocytes: 0 %
Lymphocytes Absolute: 2.4 10*3/uL (ref 0.7–3.1)
Lymphs: 47 %
MCH: 32 pg (ref 26.6–33.0)
MCHC: 33.6 g/dL (ref 31.5–35.7)
MCV: 96 fL (ref 79–97)
Monocytes Absolute: 0.5 10*3/uL (ref 0.1–0.9)
Monocytes: 10 %
Neutrophils Absolute: 2.2 10*3/uL (ref 1.4–7.0)
Neutrophils: 42 %
Platelets: 251 10*3/uL (ref 150–450)
RBC: 4.62 x10E6/uL (ref 4.14–5.80)
RDW: 11.9 % (ref 11.6–15.4)
WBC: 5.2 10*3/uL (ref 3.4–10.8)

## 2019-08-12 ENCOUNTER — Encounter: Payer: Self-pay | Admitting: Vascular Surgery

## 2019-08-12 ENCOUNTER — Ambulatory Visit (INDEPENDENT_AMBULATORY_CARE_PROVIDER_SITE_OTHER): Payer: Medicaid Other | Admitting: Vascular Surgery

## 2019-08-12 ENCOUNTER — Telehealth: Payer: Self-pay | Admitting: Internal Medicine

## 2019-08-12 ENCOUNTER — Other Ambulatory Visit: Payer: Self-pay

## 2019-08-12 ENCOUNTER — Encounter: Payer: Self-pay | Admitting: *Deleted

## 2019-08-12 ENCOUNTER — Other Ambulatory Visit: Payer: Self-pay | Admitting: *Deleted

## 2019-08-12 ENCOUNTER — Other Ambulatory Visit (HOSPITAL_COMMUNITY)
Admission: RE | Admit: 2019-08-12 | Discharge: 2019-08-12 | Disposition: A | Discharge status: Home / Self Care | Payer: Medicaid Other | Source: Ambulatory Visit | Attending: Vascular Surgery | Admitting: Vascular Surgery

## 2019-08-12 VITALS — BP 151/81 | HR 98 | Temp 97.9°F | Resp 20 | Ht 67.0 in | Wt 135.0 lb

## 2019-08-12 DIAGNOSIS — Z20828 Contact with and (suspected) exposure to other viral communicable diseases: Secondary | ICD-10-CM | POA: Insufficient documentation

## 2019-08-12 DIAGNOSIS — I739 Peripheral vascular disease, unspecified: Secondary | ICD-10-CM | POA: Diagnosis not present

## 2019-08-12 DIAGNOSIS — Z01812 Encounter for preprocedural laboratory examination: Secondary | ICD-10-CM | POA: Diagnosis present

## 2019-08-12 LAB — SARS CORONAVIRUS 2 (TAT 6-24 HRS): SARS Coronavirus 2: NEGATIVE

## 2019-08-12 MED ORDER — ATORVASTATIN CALCIUM 10 MG PO TABS: 10.0000 mg | ORAL_TABLET | Freq: Every day | ORAL | Status: AC

## 2019-08-12 NOTE — Telephone Encounter (Signed)
Saw patient subsequently
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
## 2019-08-12 NOTE — Telephone Encounter (Signed)
Patient called requesting a call back from [place] to provide update on his vascular vein appt. Pt. Got done today.  To Cherice to call back patient.

## 2019-08-12 NOTE — Telephone Encounter (Signed)
Spoke with patient just wanted to give a FYI in regards to vascular appointment today. Patient scheduled for angiogram for 08/14/2019. Patient just want to let us know.

## 2019-08-14 ENCOUNTER — Encounter (HOSPITAL_COMMUNITY): Payer: Self-pay | Admitting: Vascular Surgery

## 2019-08-14 ENCOUNTER — Other Ambulatory Visit: Payer: Self-pay | Admitting: Internal Medicine

## 2019-08-14 ENCOUNTER — Ambulatory Visit (HOSPITAL_COMMUNITY)
Admission: RE | Admit: 2019-08-14 | Discharge: 2019-08-14 | Disposition: A | Discharge status: Home / Self Care | Payer: Medicaid Other | Attending: Vascular Surgery | Admitting: Vascular Surgery

## 2019-08-14 ENCOUNTER — Other Ambulatory Visit: Payer: Self-pay | Admitting: *Deleted

## 2019-08-14 ENCOUNTER — Other Ambulatory Visit: Payer: Self-pay

## 2019-08-14 ENCOUNTER — Encounter: Payer: Self-pay | Admitting: Vascular Surgery

## 2019-08-14 ENCOUNTER — Encounter (HOSPITAL_COMMUNITY)
Admission: RE | Disposition: A | Discharge status: Home / Self Care | Payer: Self-pay | Source: Home / Self Care | Attending: Vascular Surgery

## 2019-08-14 DIAGNOSIS — M67452 Ganglion, left hip: Secondary | ICD-10-CM

## 2019-08-14 DIAGNOSIS — Z7982 Long term (current) use of aspirin: Secondary | ICD-10-CM | POA: Diagnosis not present

## 2019-08-14 DIAGNOSIS — Z79899 Other long term (current) drug therapy: Secondary | ICD-10-CM | POA: Diagnosis not present

## 2019-08-14 DIAGNOSIS — L723 Sebaceous cyst: Secondary | ICD-10-CM | POA: Diagnosis not present

## 2019-08-14 DIAGNOSIS — I70212 Atherosclerosis of native arteries of extremities with intermittent claudication, left leg: Secondary | ICD-10-CM

## 2019-08-14 DIAGNOSIS — I10 Essential (primary) hypertension: Secondary | ICD-10-CM | POA: Diagnosis not present

## 2019-08-14 DIAGNOSIS — I739 Peripheral vascular disease, unspecified: Secondary | ICD-10-CM | POA: Diagnosis present

## 2019-08-14 DIAGNOSIS — F17210 Nicotine dependence, cigarettes, uncomplicated: Secondary | ICD-10-CM | POA: Insufficient documentation

## 2019-08-14 DIAGNOSIS — I70221 Atherosclerosis of native arteries of extremities with rest pain, right leg: Secondary | ICD-10-CM

## 2019-08-14 DIAGNOSIS — M79661 Pain in right lower leg: Secondary | ICD-10-CM | POA: Diagnosis not present

## 2019-08-14 HISTORY — PX: ABDOMINAL AORTOGRAM W/LOWER EXTREMITY: CATH118223

## 2019-08-14 LAB — POCT I-STAT, CHEM 8
BUN: <3 mg/dL — ABNORMAL LOW (ref 6–20)
Calcium, Ion: 1.14 mmol/L — ABNORMAL LOW (ref 1.15–1.40)
Chloride: 98 mmol/L (ref 98–111)
Creatinine, Ser: 0.6 mg/dL — ABNORMAL LOW (ref 0.61–1.24)
Glucose, Bld: 98 mg/dL (ref 70–99)
HCT: 47 % (ref 39.0–52.0)
Hemoglobin: 16 g/dL (ref 13.0–17.0)
Potassium: 4 mmol/L (ref 3.5–5.1)
Sodium: 133 mmol/L — ABNORMAL LOW (ref 135–145)
TCO2: 25 mmol/L (ref 22–32)

## 2019-08-14 SURGERY — ABDOMINAL AORTOGRAM W/LOWER EXTREMITY
Anesthesia: LOCAL | Laterality: Bilateral

## 2019-08-14 MED ORDER — SODIUM CHLORIDE 0.9 % WEIGHT BASED INFUSION: 1.0000 mL/kg/h | INTRAVENOUS | Status: DC

## 2019-08-14 MED ORDER — HEPARIN (PORCINE) IN NACL 1000-0.9 UT/500ML-% IV SOLN
INTRAVENOUS | Status: AC
  Filled 2019-08-14: qty 1000

## 2019-08-14 MED ORDER — FENTANYL CITRATE (PF) 100 MCG/2ML IJ SOLN
INTRAMUSCULAR | Status: DC | PRN
  Administered 2019-08-14 (×2): 50 ug via INTRAVENOUS

## 2019-08-14 MED ORDER — LIDOCAINE HCL (PF) 1 % IJ SOLN
INTRAMUSCULAR | Status: AC
  Filled 2019-08-14: qty 30

## 2019-08-14 MED ORDER — SODIUM CHLORIDE 0.9 % IV SOLN
INTRAVENOUS | Status: DC
  Administered 2019-08-14: 06:00:00 via INTRAVENOUS

## 2019-08-14 MED ORDER — ATORVASTATIN CALCIUM 10 MG PO TABS: 10.0000 mg | ORAL_TABLET | Freq: Every day | ORAL | 11 refills | Status: DC

## 2019-08-14 MED ORDER — HEPARIN (PORCINE) IN NACL 1000-0.9 UT/500ML-% IV SOLN
INTRAVENOUS | Status: DC | PRN
  Administered 2019-08-14 (×2): 500 mL

## 2019-08-14 MED ORDER — IODIXANOL 320 MG/ML IV SOLN
INTRAVENOUS | Status: DC | PRN
  Administered 2019-08-14: 157 mL via INTRA_ARTERIAL

## 2019-08-14 MED ORDER — LIDOCAINE HCL (PF) 1 % IJ SOLN
INTRAMUSCULAR | Status: DC | PRN
  Administered 2019-08-14 (×2): 20 mL

## 2019-08-14 MED ORDER — FENTANYL CITRATE (PF) 100 MCG/2ML IJ SOLN
INTRAMUSCULAR | Status: AC
  Filled 2019-08-14: qty 2

## 2019-08-14 MED ORDER — MIDAZOLAM HCL 2 MG/2ML IJ SOLN
INTRAMUSCULAR | Status: DC | PRN
  Administered 2019-08-14 (×2): 1 mg via INTRAVENOUS

## 2019-08-14 MED ORDER — MIDAZOLAM HCL 2 MG/2ML IJ SOLN
INTRAMUSCULAR | Status: AC
  Filled 2019-08-14: qty 2

## 2019-08-14 SURGICAL SUPPLY — 12 items
CATH ANGIO 5F BER2 65CM (CATHETERS) ×1 IMPLANT
CATH ANGIO 5F PIGTAIL 65CM (CATHETERS) ×1 IMPLANT
KIT MICROPUNCTURE NIT STIFF (SHEATH) ×1 IMPLANT
KIT PV (KITS) ×2 IMPLANT
SHEATH PINNACLE 5F 10CM (SHEATH) ×1 IMPLANT
SHEATH PROBE COVER 6X72 (BAG) ×1 IMPLANT
STOPCOCK MORSE 400PSI 3WAY (MISCELLANEOUS) ×1 IMPLANT
SYR MEDRAD MARK 7 150ML (SYRINGE) ×2 IMPLANT
TRANSDUCER W/STOPCOCK (MISCELLANEOUS) ×2 IMPLANT
TRAY PV CATH (CUSTOM PROCEDURE TRAY) ×2 IMPLANT
TUBING CIL FLEX 10 FLL-RA (TUBING) ×1 IMPLANT
WIRE BENTSON .035X145CM (WIRE) ×1 IMPLANT

## 2019-08-14 NOTE — Interval H&P Note (Signed)
History and Physical Interval Note:  08/14/2019 7:23 AM  Jaime [name]  has presented today for surgery, with the diagnosis of PVD.  The various methods of treatment have been discussed with the patient and family. After consideration of risks, benefits and other options for treatment, the patient has consented to  Procedure(s): ABDOMINAL AORTOGRAM W/LOWER EXTREMITY (Bilateral) as a surgical intervention.  The patient's history has been reviewed, patient examined, no change in status, stable for surgery.  I have reviewed the patient's chart and labs.  Questions were answered to the patient's satisfaction.     [name]

## 2019-08-14 NOTE — Progress Notes (Signed)
Site area: rt groin fa sheath Site Prior to Removal:  Level 0 Pressure Applied For: 20 minutes Manual:   yes Patient Status During Pull:  stable Post Pull Site:  Level 0 Post Pull Instructions Given:  yes Post Pull Pulses Present: rt pt dopplered Dressing Applied:  Gauze and tegaderm Bedrest begins @ 0905 Comments:   

## 2019-08-14 NOTE — Op Note (Addendum)
PATIENT: Jaime [name]      MRN: [id number] DOB: 05-16-1961    DATE OF PROCEDURE: 08/14/2019  INDICATIONS:    Jaime [name] is a 58 y.o. male who presents with bilateral lower extremity claudication and rest pain on the right consistent with critical limb ischemia.  He had evidence of multilevel arterial occlusive disease on exam.  Of note, he has a large sebaceous cyst in his left groin as documented below.     PROCEDURE:    Conscious sedation Ultrasound-guided access to the right common femoral artery Aortogram and bilateral iliac arteriogram with bilateral lower extremity runoff  SURGEON: Judeth Cornfield. Scot Dock, MD, FACS  ANESTHESIA: Local with sedation  EBL: Minimal  TECHNIQUE: The patient was brought to the peripheral vascular lab and was sedated.  The period of conscious sedation was 40 minutes.  During that time period, I was present face-to-face 100% of the time.  The patient was administered 2 mg of Versed and 100 mcg of fentanyl. The patient's heart rate, blood pressure, and oxygen saturation were monitored by the nurse continuously during the procedure.  Both groins were prepped and draped in the usual sterile fashion.  Under ultrasound guidance, after the skin was anesthetized, I cannulated the right common femoral artery with a micropuncture needle and a micropuncture sheath was introduced over a wire.  This was exchanged for a 5 [place] sheath over a Bentson wire.  By ultrasound the femoral artery was patent. A real-time image was obtained and sent to the server.  Using a Berenstein 2 catheter I was able to get the wire up past the plaque in the right common iliac artery.  A pigtail catheter was positioned at the L1 vertebral body and flush aortogram obtained.  The catheter was positioned above the aortic bifurcation and oblique iliac projections were obtained.  I then advanced the catheter and a lateral projection was obtained.  The catheter was in position above the aortic  bifurcation and bilateral lower extremity runoff films were obtained.  FINDINGS:   1.  By ultrasound there is extensive plaque in both common femoral arteries. 2.  There are single renal arteries bilaterally with no significant renal artery stenosis identified.  The celiac axis and superior mesenteric artery appear to have a common origin.  The IMA is patent. 3.  The infrarenal aorta is patent although there is an eccentric plaque in the distal aorta. 4.  On the right side, which is the more symptomatic side, there is a bulky calcific plaque producing a 90% stenosis in the right common iliac artery.  At the proximal right external iliac artery there was a calcific plaque producing an approximately 80% narrowing.  There is a bulky calcific plaque in the right common femoral artery.  The superficial femoral artery has moderate diffuse disease throughout and the popliteal artery is severely diseased especially above the knee.  There is three-vessel runoff on the right via the anterior tibial, posterior tibial, and peroneal arteries. 5.  On the left side there is plaque in the common iliac artery and proximal external iliac artery.  The external iliac artery is occluded extending down into the common femoral artery.  There is reconstitution of the deep femoral artery and superficial femoral artery.  There is occlusion of the distal superficial femoral artery and disease in the popliteal artery.  There is three-vessel runoff on the left via the anterior tibial, posterior tibial, and peroneal arteries.  CLINICAL NOTE: This patient will need bilateral femoral endarterectomies and aortofemoral  bypass grafting.  However he has a sebaceous cyst in the left groin and this is going to have to be addressed preoperatively given the risk of infection if this is not addressed first.  I have started him on a statin.  He is on aspirin.  I will arrange for him to see general surgery for of evaluation to address the sebaceous  cyst.  Waverly Ferrari, MD, FACS [facility]  DATE OF DICTATION:   08/14/2019

## 2019-08-14 NOTE — Discharge Instructions (Signed)

## 2019-08-18 ENCOUNTER — Encounter (HOSPITAL_COMMUNITY): Payer: Self-pay | Admitting: Vascular Surgery

## 2019-08-28 ENCOUNTER — Other Ambulatory Visit: Payer: Self-pay

## 2019-08-28 MED ORDER — ATORVASTATIN CALCIUM 10 MG PO TABS: 10.0000 mg | ORAL_TABLET | Freq: Every day | ORAL | 11 refills | Status: DC

## 2019-08-31 ENCOUNTER — Telehealth: Payer: Self-pay | Admitting: Internal Medicine

## 2019-08-31 NOTE — Telephone Encounter (Signed)
noted 

## 2019-08-31 NOTE — Telephone Encounter (Signed)
Patient called to informed Jaime [name] was told by the pharmacy his Cholesterol medication can not be filled until 09/06/2019. Nicky asked pt. Is something has to be done from [place]; he stated is only for Jaime [name] to be aware .   FYI.Jaime [name]

## 2019-09-04 ENCOUNTER — Ambulatory Visit: Payer: Self-pay | Admitting: Surgery
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
## 2019-09-08 ENCOUNTER — Telehealth: Payer: Self-pay | Admitting: Licensed Clinical Social Worker

## 2019-09-08 NOTE — Telephone Encounter (Signed)
Spoke with patient. Informed to continue taking it and just take atorvastatin once a day. Patient verbalized understanding.

## 2019-09-08 NOTE — Progress Notes (Signed)
[place] ([place]), [place] - [address] [place] ([place]) [place] [address] Phone: [phone] Fax: [phone]      Your procedure is scheduled on Tuesday 09/15/2019.  Report to [facility] Main Entrance "A" at 05:30 A.M., and check in at the Admitting office.  Call this number if you have problems the morning of surgery:  [phone]   Call [phone] if you have any questions prior to your surgery date Monday-Friday 8am-4pm    Remember:  Do not eat or drink after midnight the night before your surgery     Take these medicines the morning of surgery with A SIP OF WATER: Atorvastatin (Lipitor) Bupropion (Wellbutrin XL) Tamsulosin (Flomax)  As of today, STOP taking any Aleve, Naproxen, Ibuprofen, Motrin, Advil, Goody's, BC's, all herbal medications, fish oil, and all vitamins.  Follow your surgeon's instructions on when to stop Aspirin.  If no instructions were given by your surgeon then you will need to call the office to get those instructions.      The Morning of Surgery  Do not wear jewelry.  Do not wear lotions, powders, colognes, or deodorant  Men may shave face and neck.  Do not bring valuables to the hospital.  [facility] is not responsible for any belongings or valuables.  If you are a smoker, DO NOT Smoke 24 hours prior to surgery  If you wear a CPAP at night please bring your mask, tubing, and machine the morning of surgery   Remember that you must have someone to transport you home after your surgery, and remain with you for 24 hours if you are discharged the same day.   Please bring cases for contacts, glasses, hearing aids, dentures or bridgework because it cannot be worn into surgery.    Leave your suitcase in the car.  After surgery it may be brought to your room.  For patients admitted to the hospital, discharge time will be determined by your treatment team.  Patients discharged the day of  surgery will not be allowed to drive home.    Special instructions:   [place]- Preparing For Surgery  Before surgery, you can play an important role. Because skin is not sterile, your skin needs to be as free of germs as possible. You can reduce the number of germs on your skin by washing with CHG (chlorahexidine gluconate) Soap before surgery.  CHG is an antiseptic cleaner which kills germs and bonds with the skin to continue killing germs even after washing.    Oral Hygiene is also important to reduce your risk of infection.  Remember - BRUSH YOUR TEETH THE MORNING OF SURGERY WITH YOUR REGULAR TOOTHPASTE  Please do not use if you have an allergy to CHG or antibacterial soaps. If your skin becomes reddened/irritated stop using the CHG.  Do not shave (including legs and underarms) for at least 48 hours prior to first CHG shower. It is OK to shave your face.  Please follow these instructions carefully.   1. Shower the NIGHT BEFORE SURGERY and the MORNING OF SURGERY with CHG Soap.   2. If you chose to wash your hair, wash your hair first as usual with your normal shampoo.  3. After you shampoo, rinse your hair and body thoroughly to remove the shampoo.  4. Use CHG as you would any other liquid soap. You can apply CHG directly to the skin and wash gently with a scrungie or a clean washcloth.  5. Apply the CHG Soap to your body ONLY FROM THE NECK DOWN.  Do not use on open wounds or open sores. Avoid contact with your eyes, ears, mouth and genitals (private parts). Wash Face and genitals (private parts)  with your normal soap.   6. Wash thoroughly, paying special attention to the area where your surgery will be performed.  7. Thoroughly rinse your body with warm water from the neck down.  8. DO NOT shower/wash with your normal soap after using and rinsing off the CHG Soap.  9. Pat yourself dry with a CLEAN TOWEL.  10. Wear CLEAN PAJAMAS to bed the night before surgery, wear  comfortable clothes the morning of surgery  11. Place CLEAN SHEETS on your bed the night of your first shower and DO NOT SLEEP WITH PETS.    Day of Surgery:  Please shower the morning of surgery with the CHG soap Do not apply any deodorants/lotions. Please wear clean clothes to the hospital/surgery center.   Remember to brush your teeth WITH YOUR REGULAR TOOTHPASTE.   Please read over the following fact sheets that you were given.

## 2019-09-08 NOTE — Telephone Encounter (Signed)
Patient called to speak with CMA. CMA was unavailable. Patient reports Dr. Bobette Mo and Dr. Amil Amen prescribed him the same medication and he would like to know what he is "supposed to do with that"?  SWK took patient's name and phone number and let him know CMA would call when she is available.

## 2019-09-09 ENCOUNTER — Encounter (HOSPITAL_COMMUNITY): Payer: Self-pay

## 2019-09-09 ENCOUNTER — Encounter (HOSPITAL_COMMUNITY)
Admission: RE | Admit: 2019-09-09 | Discharge: 2019-09-09 | Disposition: A | Discharge status: Home / Self Care | Payer: Medicaid Other | Source: Ambulatory Visit | Attending: Surgery | Admitting: Surgery

## 2019-09-09 ENCOUNTER — Other Ambulatory Visit: Payer: Self-pay

## 2019-09-09 DIAGNOSIS — F329 Major depressive disorder, single episode, unspecified: Secondary | ICD-10-CM | POA: Diagnosis not present

## 2019-09-09 DIAGNOSIS — Z7982 Long term (current) use of aspirin: Secondary | ICD-10-CM | POA: Insufficient documentation

## 2019-09-09 DIAGNOSIS — I739 Peripheral vascular disease, unspecified: Secondary | ICD-10-CM | POA: Insufficient documentation

## 2019-09-09 DIAGNOSIS — L729 Follicular cyst of the skin and subcutaneous tissue, unspecified: Secondary | ICD-10-CM | POA: Diagnosis not present

## 2019-09-09 DIAGNOSIS — Z8673 Personal history of transient ischemic attack (TIA), and cerebral infarction without residual deficits: Secondary | ICD-10-CM | POA: Diagnosis not present

## 2019-09-09 DIAGNOSIS — Z01812 Encounter for preprocedural laboratory examination: Secondary | ICD-10-CM | POA: Diagnosis present

## 2019-09-09 DIAGNOSIS — Z79899 Other long term (current) drug therapy: Secondary | ICD-10-CM | POA: Insufficient documentation

## 2019-09-09 DIAGNOSIS — I10 Essential (primary) hypertension: Secondary | ICD-10-CM | POA: Insufficient documentation

## 2019-09-09 DIAGNOSIS — Z87891 Personal history of nicotine dependence: Secondary | ICD-10-CM | POA: Diagnosis not present

## 2019-09-09 HISTORY — DX: Benign prostatic hyperplasia without lower urinary tract symptoms: N40.0

## 2019-09-09 HISTORY — DX: Depression, unspecified: F32.A

## 2019-09-09 HISTORY — DX: Peripheral vascular disease, unspecified: I73.9

## 2019-09-09 HISTORY — DX: Fracture of unspecified part of neck of unspecified femur, initial encounter for closed fracture: S72.009A

## 2019-09-09 LAB — BASIC METABOLIC PANEL
Anion gap: 11 (ref 5–15)
BUN: <5 mg/dL — ABNORMAL LOW (ref 6–20)
CO2: 24 mmol/L (ref 22–32)
Calcium: 9.1 mg/dL (ref 8.9–10.3)
Chloride: 95 mmol/L — ABNORMAL LOW (ref 98–111)
Creatinine, Ser: 0.74 mg/dL (ref 0.61–1.24)
GFR calc Af Amer: >60 mL/min (ref >60)
GFR calc non Af Amer: >60 mL/min (ref >60)
Glucose, Bld: 72 mg/dL (ref 70–99)
Potassium: 4.1 mmol/L (ref 3.5–5.1)
Sodium: 130 mmol/L — ABNORMAL LOW (ref 135–145)

## 2019-09-09 LAB — CBC
HCT: 46 % (ref 39.0–52.0)
Hemoglobin: 15.4 g/dL (ref 13.0–17.0)
MCH: 32 pg (ref 26.0–34.0)
MCHC: 33.5 g/dL (ref 30.0–36.0)
MCV: 95.4 fL (ref 80.0–100.0)
Platelets: 256 10*3/uL (ref 150–400)
RBC: 4.82 MIL/uL (ref 4.22–5.81)
RDW: 11.5 % (ref 11.5–15.5)
WBC: 6 10*3/uL (ref 4.0–10.5)
nRBC: 0 % (ref 0.0–0.2)

## 2019-09-09 NOTE — Anesthesia Preprocedure Evaluation (Addendum)
Anesthesia Evaluation  Patient identified by MRN, date of birth, ID band Patient awake    Reviewed: Allergy & Precautions, H&P , NPO status , Patient's Chart, lab work & pertinent test results  Airway Mallampati: II  TM Distance: >3 FB Neck ROM: Full    Dental no notable dental hx. (+) Teeth Intact, Dental Advisory Given   Pulmonary neg pulmonary ROS, Current Smoker,    Pulmonary exam normal breath sounds clear to auscultation       Cardiovascular Exercise Tolerance: Good hypertension, + Peripheral Vascular Disease  negative cardio ROS Normal cardiovascular exam Rhythm:Regular Rate:Normal     Neuro/Psych PSYCHIATRIC DISORDERS Depression  Neuromuscular disease negative neurological ROS  negative psych ROS   GI/Hepatic negative GI ROS, Neg liver ROS,   Endo/Other  negative endocrine ROS  Renal/GU negative Renal ROS  negative genitourinary   Musculoskeletal negative musculoskeletal ROS (+)   Abdominal   Peds negative pediatric ROS (+)  Hematology negative hematology ROS (+)   Anesthesia Other Findings CLINICAL NOTE: This patient will need bilateral femoral endarterectomies and aortofemoral bypass grafting.  However he has a sebaceous cyst in the left groin and this is going to have to be addressed preoperatively given the risk of infection if this is not addressed first.  Reproductive/Obstetrics negative OB ROS                           Anesthesia Physical Anesthesia Plan  ASA: III  Anesthesia Plan: MAC   Post-op Pain Management:    Induction: Intravenous  PONV Risk Score and Plan:   Airway Management Planned: Mask, Natural Airway, Nasal Cannula and Simple Face Mask  Additional Equipment:   Intra-op Plan:   Post-operative Plan:   Informed Consent: I have reviewed the patients History and Physical, chart, labs and discussed the procedure including the risks, benefits and  alternatives for the proposed anesthesia with the patient or authorized representative who has indicated his/her understanding and acceptance.       Plan Discussed with: CRNA and Anesthesiologist  Anesthesia Plan Comments: (PAT note written 09/09/2019 by Myra Gianotti, PA-C. )       Anesthesia Quick Evaluation
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
## 2019-09-09 NOTE — Progress Notes (Signed)
PCP - Dr. Mack Hook Cardiologist - denies Vascular - Dr. [name]  PPM/ICD - denies Device Orders - N/A Rep Notified - N/A  Chest x-ray - denies EKG - 08/14/2019 Stress Test - per patient "about 8-9 years ago for a physical exam at some clinic in [place]" ECHO - denies Cardiac Cath - denies  Sleep Study - denies CPAP - N/A  Blood Thinner Instructions: N/A Aspirin Instructions: Surgeon's office number given, patient instructed to call today and find out pre-op instructions for aspirin, patient verbalized understanding  ERAS Protcol - No PRE-SURGERY Ensure or G2-  N/A  COVID TEST- Scheduled for Friday, 09/11/2019. Patient verbalized understanding of self-quarantine instruction, patient appointment place and time.  Anesthesia review: No  Patient denies shortness of breath, fever, cough and chest pain at PAT appointment  All instructions explained to the patient, with a verbal understanding of the material. Patient agrees to go over the instructions while at home for a better understanding. Patient also instructed to self quarantine after being tested for COVID-19. The opportunity to ask questions was provided.

## 2019-09-11 ENCOUNTER — Other Ambulatory Visit (HOSPITAL_COMMUNITY)
Admission: RE | Admit: 2019-09-11 | Discharge: 2019-09-11 | Disposition: A | Discharge status: Home / Self Care | Payer: Medicaid Other | Source: Ambulatory Visit | Attending: Surgery | Admitting: Surgery

## 2019-09-11 ENCOUNTER — Other Ambulatory Visit (HOSPITAL_COMMUNITY): Payer: Medicaid Other

## 2019-09-11 DIAGNOSIS — Z01812 Encounter for preprocedural laboratory examination: Secondary | ICD-10-CM | POA: Diagnosis present

## 2019-09-11 DIAGNOSIS — Z20828 Contact with and (suspected) exposure to other viral communicable diseases: Secondary | ICD-10-CM | POA: Insufficient documentation

## 2019-09-13 LAB — NOVEL CORONAVIRUS, NAA (HOSP ORDER, SEND-OUT TO REF LAB; TAT 18-24 HRS): SARS-CoV-2, NAA: NOT DETECTED

## 2019-09-15 ENCOUNTER — Ambulatory Visit (HOSPITAL_COMMUNITY)
Admission: RE | Admit: 2019-09-15 | Discharge: 2019-09-15 | Disposition: A | Discharge status: Home / Self Care | Payer: Medicaid Other | Attending: Surgery | Admitting: Surgery

## 2019-09-15 ENCOUNTER — Other Ambulatory Visit: Payer: Self-pay

## 2019-09-15 ENCOUNTER — Encounter (HOSPITAL_COMMUNITY)
Admission: RE | Disposition: A | Discharge status: Home / Self Care | Payer: Self-pay | Source: Home / Self Care | Attending: Surgery

## 2019-09-15 ENCOUNTER — Ambulatory Visit (HOSPITAL_COMMUNITY): Payer: Medicaid Other | Admitting: Vascular Surgery

## 2019-09-15 ENCOUNTER — Encounter (HOSPITAL_COMMUNITY): Payer: Self-pay

## 2019-09-15 ENCOUNTER — Ambulatory Visit (HOSPITAL_COMMUNITY): Payer: Medicaid Other | Admitting: Certified Registered Nurse Anesthetist

## 2019-09-15 DIAGNOSIS — I739 Peripheral vascular disease, unspecified: Secondary | ICD-10-CM | POA: Diagnosis not present

## 2019-09-15 DIAGNOSIS — L723 Sebaceous cyst: Secondary | ICD-10-CM | POA: Diagnosis present

## 2019-09-15 DIAGNOSIS — Z8489 Family history of other specified conditions: Secondary | ICD-10-CM | POA: Diagnosis not present

## 2019-09-15 DIAGNOSIS — I10 Essential (primary) hypertension: Secondary | ICD-10-CM | POA: Diagnosis not present

## 2019-09-15 DIAGNOSIS — F17210 Nicotine dependence, cigarettes, uncomplicated: Secondary | ICD-10-CM | POA: Diagnosis not present

## 2019-09-15 DIAGNOSIS — L720 Epidermal cyst: Secondary | ICD-10-CM | POA: Insufficient documentation

## 2019-09-15 DIAGNOSIS — Z7982 Long term (current) use of aspirin: Secondary | ICD-10-CM | POA: Diagnosis not present

## 2019-09-15 DIAGNOSIS — Z86010 Personal history of colonic polyps: Secondary | ICD-10-CM | POA: Insufficient documentation

## 2019-09-15 DIAGNOSIS — Z79899 Other long term (current) drug therapy: Secondary | ICD-10-CM | POA: Diagnosis not present

## 2019-09-15 DIAGNOSIS — Z8249 Family history of ischemic heart disease and other diseases of the circulatory system: Secondary | ICD-10-CM | POA: Diagnosis not present

## 2019-09-15 HISTORY — PX: MASS EXCISION: SHX2000

## 2019-09-15 SURGERY — EXCISION MASS
Anesthesia: Monitor Anesthesia Care | Site: Groin | Laterality: Left

## 2019-09-15 MED ORDER — ONDANSETRON HCL 4 MG/2ML IJ SOLN
INTRAMUSCULAR | Status: AC
  Filled 2019-09-15: qty 2

## 2019-09-15 MED ORDER — ACETAMINOPHEN 160 MG/5ML PO SOLN: 325.0000 mg | ORAL | Status: DC | PRN

## 2019-09-15 MED ORDER — LIDOCAINE 2% (20 MG/ML) 5 ML SYRINGE
INTRAMUSCULAR | Status: AC
  Filled 2019-09-15: qty 5

## 2019-09-15 MED ORDER — TRAMADOL HCL 50 MG PO TABS: 50.0000 mg | ORAL_TABLET | Freq: Four times a day (QID) | ORAL | 0 refills | Status: DC | PRN

## 2019-09-15 MED ORDER — MIDAZOLAM HCL 2 MG/2ML IJ SOLN
INTRAMUSCULAR | Status: AC
  Filled 2019-09-15: qty 2

## 2019-09-15 MED ORDER — SODIUM CHLORIDE 0.9 % IV SOLN: 250.0000 mL | INTRAVENOUS | Status: DC | PRN

## 2019-09-15 MED ORDER — ACETAMINOPHEN 325 MG PO TABS: 650.0000 mg | ORAL_TABLET | ORAL | Status: DC | PRN

## 2019-09-15 MED ORDER — SODIUM CHLORIDE 0.9% FLUSH: 3.0000 mL | INTRAVENOUS | Status: DC | PRN

## 2019-09-15 MED ORDER — ACETAMINOPHEN 325 MG PO TABS: 325.0000 mg | ORAL_TABLET | ORAL | Status: DC | PRN

## 2019-09-15 MED ORDER — ONDANSETRON HCL 4 MG/2ML IJ SOLN
INTRAMUSCULAR | Status: DC | PRN
  Administered 2019-09-15: 4 mg via INTRAVENOUS

## 2019-09-15 MED ORDER — OXYCODONE HCL 5 MG PO TABS: 5.0000 mg | ORAL_TABLET | ORAL | Status: DC | PRN

## 2019-09-15 MED ORDER — ACETAMINOPHEN 650 MG RE SUPP: 650.0000 mg | RECTAL | Status: DC | PRN

## 2019-09-15 MED ORDER — SODIUM CHLORIDE 0.9% FLUSH: 3.0000 mL | Freq: Two times a day (BID) | INTRAVENOUS | Status: DC

## 2019-09-15 MED ORDER — LACTATED RINGERS IV SOLN
INTRAVENOUS | Status: DC | PRN
  Administered 2019-09-15: 07:00:00 via INTRAVENOUS

## 2019-09-15 MED ORDER — FENTANYL CITRATE (PF) 100 MCG/2ML IJ SOLN
INTRAMUSCULAR | Status: DC | PRN
  Administered 2019-09-15 (×2): 50 ug via INTRAVENOUS

## 2019-09-15 MED ORDER — CHLORHEXIDINE GLUCONATE CLOTH 2 % EX PADS: 6.0000 | MEDICATED_PAD | Freq: Once | CUTANEOUS | Status: DC

## 2019-09-15 MED ORDER — PROPOFOL 10 MG/ML IV BOLUS
INTRAVENOUS | Status: AC
  Filled 2019-09-15: qty 20

## 2019-09-15 MED ORDER — FENTANYL CITRATE (PF) 100 MCG/2ML IJ SOLN: 25.0000 ug | INTRAMUSCULAR | Status: DC | PRN

## 2019-09-15 MED ORDER — FENTANYL CITRATE (PF) 250 MCG/5ML IJ SOLN
INTRAMUSCULAR | Status: AC
  Filled 2019-09-15: qty 5

## 2019-09-15 MED ORDER — STERILE WATER FOR IRRIGATION IR SOLN
Status: DC | PRN
  Administered 2019-09-15: 1000 mL

## 2019-09-15 MED ORDER — OXYCODONE HCL 5 MG/5ML PO SOLN: 5.0000 mg | Freq: Once | ORAL | Status: DC | PRN

## 2019-09-15 MED ORDER — ACETAMINOPHEN 500 MG PO TABS
1000.0000 mg | ORAL_TABLET | ORAL | Status: AC
  Administered 2019-09-15: 1000 mg via ORAL
  Filled 2019-09-15: qty 2

## 2019-09-15 MED ORDER — MIDAZOLAM HCL 5 MG/5ML IJ SOLN
INTRAMUSCULAR | Status: DC | PRN
  Administered 2019-09-15: 2 mg via INTRAVENOUS

## 2019-09-15 MED ORDER — 0.9 % SODIUM CHLORIDE (POUR BTL) OPTIME
TOPICAL | Status: DC | PRN
  Administered 2019-09-15: 1000 mL

## 2019-09-15 MED ORDER — MEPERIDINE HCL 25 MG/ML IJ SOLN: 6.2500 mg | INTRAMUSCULAR | Status: DC | PRN

## 2019-09-15 MED ORDER — BUPIVACAINE-EPINEPHRINE 0.5% -1:200000 IJ SOLN
INTRAMUSCULAR | Status: AC
  Filled 2019-09-15: qty 1

## 2019-09-15 MED ORDER — BUPIVACAINE-EPINEPHRINE (PF) 0.5% -1:200000 IJ SOLN
INTRAMUSCULAR | Status: DC | PRN
  Administered 2019-09-15: 7 mL

## 2019-09-15 MED ORDER — CEFAZOLIN SODIUM-DEXTROSE 2-4 GM/100ML-% IV SOLN
2.0000 g | INTRAVENOUS | Status: AC
  Administered 2019-09-15: 2 g via INTRAVENOUS
  Filled 2019-09-15: qty 100

## 2019-09-15 MED ORDER — PROPOFOL 500 MG/50ML IV EMUL
INTRAVENOUS | Status: DC | PRN
  Administered 2019-09-15: 100 ug/kg/min via INTRAVENOUS

## 2019-09-15 MED ORDER — OXYCODONE HCL 5 MG PO TABS: 5.0000 mg | ORAL_TABLET | Freq: Once | ORAL | Status: DC | PRN

## 2019-09-15 MED ORDER — ONDANSETRON HCL 4 MG/2ML IJ SOLN: 4.0000 mg | Freq: Once | INTRAMUSCULAR | Status: DC | PRN

## 2019-09-15 SURGICAL SUPPLY — 29 items
CANISTER SUCT 3000ML PPV (MISCELLANEOUS) IMPLANT
COVER SURGICAL LIGHT HANDLE (MISCELLANEOUS) ×3 IMPLANT
COVER WAND RF STERILE (DRAPES) IMPLANT
DECANTER SPIKE VIAL GLASS SM (MISCELLANEOUS) IMPLANT
DERMABOND ADVANCED (GAUZE/BANDAGES/DRESSINGS) ×2
DERMABOND ADVANCED .7 DNX12 (GAUZE/BANDAGES/DRESSINGS) ×1 IMPLANT
DRAPE LAPAROSCOPIC ABDOMINAL (DRAPES) IMPLANT
DRAPE LAPAROTOMY 100X72 PEDS (DRAPES) ×3 IMPLANT
ELECT REM PT RETURN 9FT ADLT (ELECTROSURGICAL) ×3
ELECTRODE REM PT RTRN 9FT ADLT (ELECTROSURGICAL) ×1 IMPLANT
GAUZE SPONGE 4X4 12PLY STRL (GAUZE/BANDAGES/DRESSINGS) IMPLANT
GLOVE BIO SURGEON STRL SZ 6 (GLOVE) ×3 IMPLANT
GLOVE INDICATOR 6.5 STRL GRN (GLOVE) ×3 IMPLANT
GOWN STRL REUS W/ TWL LRG LVL3 (GOWN DISPOSABLE) ×1 IMPLANT
GOWN STRL REUS W/TWL LRG LVL3 (GOWN DISPOSABLE) ×2
KIT BASIN OR (CUSTOM PROCEDURE TRAY) ×3 IMPLANT
KIT TURNOVER KIT B (KITS) ×3 IMPLANT
NEEDLE HYPO 25GX1X1/2 BEV (NEEDLE) ×3 IMPLANT
NS IRRIG 1000ML POUR BTL (IV SOLUTION) ×3 IMPLANT
PACK GENERAL/GYN (CUSTOM PROCEDURE TRAY) ×3 IMPLANT
PAD ARMBOARD 7.5X6 YLW CONV (MISCELLANEOUS) ×3 IMPLANT
PENCIL SMOKE EVACUATOR (MISCELLANEOUS) ×3 IMPLANT
SPECIMEN JAR MEDIUM (MISCELLANEOUS) ×3 IMPLANT
SUT MNCRL AB 4-0 PS2 18 (SUTURE) ×3 IMPLANT
SUT VIC AB 3-0 SH 27 (SUTURE) ×2
SUT VIC AB 3-0 SH 27XBRD (SUTURE) ×1 IMPLANT
SYR CONTROL 10ML LL (SYRINGE) ×3 IMPLANT
TOWEL GREEN STERILE (TOWEL DISPOSABLE) ×3 IMPLANT
TOWEL GREEN STERILE FF (TOWEL DISPOSABLE) ×3 IMPLANT

## 2019-09-15 NOTE — Transfer of Care (Signed)
Immediate Anesthesia Transfer of Care Note  Patient: Jaime [name]  Procedure(s) Performed: EXCISION OF SUBCUTANEOUS CYST GROIN (Left Groin)  Patient Location: PACU  Anesthesia Type:MAC  Level of Consciousness: awake, alert  and oriented  Airway & Oxygen Therapy: Patient Spontanous Breathing  Post-op Assessment: Report given to RN, Post -op Vital signs reviewed and stable and Patient moving all extremities X 4  Post vital signs: Reviewed and stable  Last Vitals:  Vitals Value Taken Time  BP 108/73 09/15/19 0806  Temp    Pulse    Resp    SpO2    Vitals shown include unvalidated device data.  Last Pain:  Vitals:   09/15/19 0644  PainSc: 7       Patients Stated Pain Goal: 3 (78/24/23 5361)  Complications: No apparent anesthesia complications

## 2019-09-15 NOTE — Op Note (Signed)
Operative Note  Jaime [name]  [id number]  [id number]  09/15/2019   Surgeon: Vikki Ports A ConnorMD  Assistant: OR staff  Procedure performed: Excision of left groin subcutaneous cyst, 3 x 4 cm  Preop diagnosis: Subcutaneous cyst Post-op diagnosis/intraop findings: Same  Specimens: Left groin cyst Retained items: None EBL: Minimal cc Complications: none  Description of procedure: After obtaining informed consent the patient was taken to the operating room and placed supine on operating room table wheremonitored anesthesia care was initiated, preoperative antibiotics were administered, SCDs applied, and a formal timeout was performed.  The left groin was clipped, prepped and draped in usual sterile fashion.  After infiltration with quarter percent Marcaine, an elliptical incision was made in the skin overlying the cyst and then cautery and blunt dissection were used to dissect the cyst wall away from the surrounding soft tissues.  The cyst was removed intact.  Hemostasis was ensured within the wound.  Incision was closed with deep dermal 3-0 Vicryl and running subcuticular Monocryl followed by Dermabond.  The patient was then awakened and taken to PACU in stable condition.   All counts were correct at the completion of the case.

## 2019-09-15 NOTE — Interval H&P Note (Signed)
History and Physical Interval Note:  09/15/2019 6:54 AM  Jaime [name]  has presented today for surgery, with the diagnosis of SUBCUTANEOUS CYST.  The various methods of treatment have been discussed with the patient and family. After consideration of risks, benefits and other options for treatment, the patient has consented to  Procedure(s): EXCISION OF SUBCUTANEOUS CYST GROIN (N/A) as a surgical intervention.  The patient's history has been reviewed, patient examined, no change in status, stable for surgery.  I have reviewed the patient's chart and labs.  Questions were answered to the patient's satisfaction.     Danyel Tobey Rich Brave

## 2019-09-15 NOTE — Discharge Instructions (Signed)
GENERAL SURGERY: POST OP INSTRUCTIONS  ######################################################################  EAT Gradually transition to a high fiber diet with a fiber supplement over the next few weeks after discharge.  Start with a pureed / full liquid diet (see below)  WALK Walk an hour a day.  Control your pain to do that.    CONTROL PAIN Control pain so that you can walk, sleep, tolerate sneezing/coughing, go up/down stairs.  HAVE A BOWEL MOVEMENT DAILY Keep your bowels regular to avoid problems.  OK to try a laxative to override constipation.  OK to use an antidairrheal to slow down diarrhea.  Call if not better after 2 tries  CALL IF YOU HAVE PROBLEMS/CONCERNS Call if you are still struggling despite following these instructions. Call if you have concerns not answered by these instructions  ######################################################################    1. DIET: Follow a light bland diet & liquids the first 24 hours after arrival home, such as soup, liquids, starches, etc.  Be sure to drink plenty of fluids.  Quickly advance to a usual solid diet within a few days.  Avoid fast food or heavy meals as your are more likely to get nauseated or have irregular bowels.  A low-sugar, high-fiber diet for the rest of your life is ideal.   2. Take your usually prescribed home medications unless otherwise directed. 3. PAIN CONTROL: a. Pain is best controlled by a usual combination of three different methods TOGETHER: i. Ice/Heat ii. Over the counter pain medication iii. Prescription pain medication b. Most patients will experience some swelling and bruising around the incisions.  Ice packs or heating pads (30-60 minutes up to 6 times a day) will help. Use ice for the first few days to help decrease swelling and bruising, then switch to heat to help relax tight/sore spots and speed recovery.  Some people prefer to use ice alone, heat alone, alternating between ice & heat.   Experiment to what works for you.  Swelling and bruising can take several weeks to resolve.   i. It is helpful to take an over-the-counter pain medication regularly for the first few days: Acetaminophen (Tylenol, etc) 500-650mg  four times a day (every meal & bedtime) c. A  prescription for pain medication should be given to you upon discharge.  Take your pain medication as prescribed.  i. If you are having problems/concerns with the prescription medicine (does not control pain, nausea, vomiting, rash, itching, etc), please call us [phone] to see if we need to switch you to a different pain medicine that will work better for you and/or control your side effect better. ii. If you need a refill on your pain medication, please contact your pharmacy.  They will contact our office to request authorization. Prescriptions will not be filled after 5 pm or on week-ends. 4. Avoid getting constipated.  Between the surgery and the pain medications, it is common to experience some constipation.  Increasing fluid intake and taking a fiber supplement (such as Metamucil, Citrucel, FiberCon, MiraLax, etc) 1-2 times a day regularly will usually help prevent this problem from occurring.  A mild laxative (prune juice, Milk of Magnesia, MiraLax, etc) should be taken according to package directions if there are no bowel movements after 48 hours.   5. Wash / shower every day, starting 2 days after surgery.  No rubbing, scrubbing, lotions or ointments to incision. No soaking or submerging for at least 2 weeks. 6. Glue will flake off after 2 weeks.  You may leave the incision open to air.  You may have skin tapes (Steri Strips) covering the incision(s).  Leave them on until one week, then remove.  You may replace a dressing/Band-Aid to cover the incision for comfort if you wish.      7. ACTIVITIES as tolerated:   a. You may resume regular (light) daily activities beginning the next day--such as daily self-care, walking,  climbing stairs--gradually increasing activities as tolerated.  If you can walk 30 minutes without difficulty, it is safe to try more intense activity such as jogging, treadmill, bicycling, low-impact aerobics, swimming, etc. b. Save the most intensive and strenuous activity for last such as sit-ups, heavy lifting, contact sports, etc  Refrain from any heavy lifting or straining until you are off narcotics for pain control.   c. DO NOT PUSH THROUGH PAIN.  Let pain be your guide: If it hurts to do something, don't do it.  Pain is your body warning you to avoid that activity for another week until the pain goes down. d. You may drive when you are no longer taking prescription pain medication, you can comfortably wear a seatbelt, and you can safely maneuver your car and apply brakes. e. Dennis Bast may have sexual intercourse when it is comfortable.  8. FOLLOW UP in our office a. Please call [facility] at [phone] to set up an appointment to see your surgeon in the office for a follow-up appointment approximately 2-3 weeks after your surgery. b. Make sure that you call for this appointment the day you arrive home to insure a convenient appointment time. 9. IF YOU HAVE DISABILITY OR FAMILY LEAVE FORMS, BRING THEM TO THE OFFICE FOR PROCESSING.  DO NOT GIVE THEM TO YOUR DOCTOR.   WHEN TO CALL us [phone]: 1. Poor pain control 2. Reactions / problems with new medications (rash/itching, nausea, etc)  3. Fever over 101.5 F (38.5 C) 4. Worsening swelling or bruising 5. Continued bleeding from incision. 6. Increased pain, redness, or drainage from the incision 7. Difficulty breathing / swallowing   The clinic staff is available to answer your questions during regular business hours (8:30am-5pm).  Please dont hesitate to call and ask to speak to one of our nurses for clinical concerns.   If you have a medical emergency, go to the nearest emergency room or call 911.  A surgeon from [facility] is  always on call at the [facility], [place], [address], [place], [place]  [address] ? MAIN: [phone] ? TOLL FREE: [phone] ?  FAX [phone] [email or web address]

## 2019-09-15 NOTE — Anesthesia Postprocedure Evaluation (Signed)
Anesthesia Post Note  Patient: Jaime [name]  Procedure(s) Performed: EXCISION OF SUBCUTANEOUS CYST GROIN (Left Groin)     Patient location during evaluation: PACU Anesthesia Type: MAC Level of consciousness: awake and alert Pain management: pain level controlled Vital Signs Assessment: post-procedure vital signs reviewed and stable Respiratory status: spontaneous breathing, nonlabored ventilation, respiratory function stable and patient connected to nasal cannula oxygen Cardiovascular status: stable and blood pressure returned to baseline Postop Assessment: no apparent nausea or vomiting Anesthetic complications: no    Last Vitals:  Vitals:   09/15/19 0821 09/15/19 0830  BP: 127/88 114/70  Pulse: 83 85  Resp: 14 11  Temp:  36.6 C  SpO2: 100% 100%    Last Pain:  Vitals:   09/15/19 0830  PainSc: 0-No pain                 Isaiyah Feldhaus

## 2019-09-15 NOTE — Anesthesia Procedure Notes (Signed)
Procedure Name: MAC Date/Time: 09/15/2019 7:40 AM Performed by: Harden Mo, CRNA Pre-anesthesia Checklist: Patient identified, Emergency Drugs available and Patient being monitored Patient Re-evaluated:Patient Re-evaluated prior to induction Oxygen Delivery Method: Simple face mask Preoxygenation: Pre-oxygenation with 100% oxygen Induction Type: IV induction Placement Confirmation: positive ETCO2 and breath sounds checked- equal and bilateral Dental Injury: Teeth and Oropharynx as per pre-operative assessment

## 2019-09-16 ENCOUNTER — Encounter (HOSPITAL_COMMUNITY): Payer: Self-pay | Admitting: Surgery

## 2019-09-16 LAB — SURGICAL PATHOLOGY

## 2019-09-22 ENCOUNTER — Other Ambulatory Visit: Payer: Self-pay | Admitting: Internal Medicine

## 2019-09-22 DIAGNOSIS — I10 Essential (primary) hypertension: Secondary | ICD-10-CM

## 2019-09-22 MED ORDER — BLOOD PRESSURE KIT DEVI: 1.0000 | Freq: Every day | 0 refills | Status: AC

## 2019-09-22 NOTE — Progress Notes (Signed)
Order for bp home monitor

## 2019-09-22 NOTE — Telephone Encounter (Signed)
Notify order sent to [facility]

## 2019-09-23 NOTE — Telephone Encounter (Signed)
Spoke with patient. Informed of Rx. Patient verbalized understanding.

## 2019-10-09 ENCOUNTER — Other Ambulatory Visit: Payer: Self-pay

## 2019-10-21 ENCOUNTER — Other Ambulatory Visit: Payer: Self-pay

## 2019-10-21 ENCOUNTER — Other Ambulatory Visit (INDEPENDENT_AMBULATORY_CARE_PROVIDER_SITE_OTHER): Payer: Medicaid Other

## 2019-10-21 DIAGNOSIS — Z13220 Encounter for screening for lipoid disorders: Secondary | ICD-10-CM | POA: Diagnosis not present

## 2019-10-21 DIAGNOSIS — Z79899 Other long term (current) drug therapy: Secondary | ICD-10-CM

## 2019-10-22 LAB — HEPATIC FUNCTION PANEL
ALT: 32 IU/L (ref 0–44)
AST: 32 IU/L (ref 0–40)
Albumin: 3.9 g/dL (ref 3.8–4.9)
Alkaline Phosphatase: 108 IU/L (ref 39–117)
Bilirubin Total: 0.7 mg/dL (ref 0.0–1.2)
Bilirubin, Direct: 0.31 mg/dL (ref 0.00–0.40)
Total Protein: 7 g/dL (ref 6.0–8.5)

## 2019-10-22 LAB — LIPID PANEL W/O CHOL/HDL RATIO
Cholesterol, Total: 111 mg/dL (ref 100–199)
HDL: 63 mg/dL (ref >39)
LDL Chol Calc (NIH): 37 mg/dL (ref 0–99)
Triglycerides: 42 mg/dL (ref 0–149)
VLDL Cholesterol Cal: 11 mg/dL (ref 5–40)

## 2019-10-30 ENCOUNTER — Ambulatory Visit: Payer: Medicaid Other | Admitting: Internal Medicine

## 2019-12-09 ENCOUNTER — Ambulatory Visit: Payer: Medicaid Other | Admitting: Internal Medicine

## 2019-12-18 ENCOUNTER — Telehealth: Payer: Self-pay | Admitting: Internal Medicine

## 2019-12-18 NOTE — Telephone Encounter (Signed)
Spoke with patient. Informed he needs to call back to vascular and let them know what is going on so they can go ahead and schedule his procedure. Patient verbalized understanding. States he will call them on monday

## 2019-12-18 NOTE — Telephone Encounter (Signed)
Patient called stating is having really bad leg pain x a month " I feel like someone is stabbing me" and will like to know if there is something over the counter that he can gets on top of baby aspirin 81 mg,  Atorvastatin 10 mg and Lisinopril 5 mg; medication patient is currently taking.  Please advise.Marland Kitchen

## 2019-12-28 ENCOUNTER — Telehealth: Payer: Self-pay | Admitting: Internal Medicine

## 2019-12-28 NOTE — Telephone Encounter (Signed)
Patient called requesting a prescription for leg/foot warmer to help blood circulation. Patient stated prescription needs to go to a medical supply store; regular pharmacies don not honor this type prescription.  Please advise.

## 2019-12-29 ENCOUNTER — Telehealth: Payer: Self-pay

## 2019-12-29 NOTE — Telephone Encounter (Signed)
Pt having leg pain and numbness and said that he is in need of an OV or pain meds.   Appt made for pt to be seen tomorrow for evaluation.   Julious Payer, CMA

## 2019-12-29 NOTE — Telephone Encounter (Signed)
Spoke with patient. States he has been calling [facility] trying to get appointment because his feet and legs are hurting really bad and feet feeling cold and numb. Patient asking if he can order leg warmers like the ones they put on patients in the hospital fr home use. Patient states he has been calling [facility] and no one will return his call. States he called after hours and a answering service informed him they would leave a message for Dr. Adele Dan nurse but she never called him back.  I called to [facility] and spoke with receptionist and was transferred to Nurse [facility]. States she is the only nurse in the office right ow and she is working with the doctor, scheduling for surgery and doing phone messages. Receptionist states nurse is very busy. I left a message for shannon to call back to the office the get more information on what patient needs to do to have procedure done now that his groin surgery has been done.   Informed patient I would let him know if she calls me back. I encourage patient to call back in the morning and try to schedule acute visit if possible. Informed to explain to scheduler what he has been experiencing. Patient verbalized understanding. Informed would pass this message to Dr. Delrae Alfred regarding leg warmers.

## 2019-12-30 ENCOUNTER — Other Ambulatory Visit: Payer: Self-pay

## 2019-12-30 ENCOUNTER — Encounter: Payer: Self-pay | Admitting: Vascular Surgery

## 2019-12-30 ENCOUNTER — Telehealth: Payer: Self-pay

## 2019-12-30 ENCOUNTER — Ambulatory Visit (INDEPENDENT_AMBULATORY_CARE_PROVIDER_SITE_OTHER): Payer: Medicaid Other | Admitting: Vascular Surgery

## 2019-12-30 VITALS — BP 134/72 | HR 100 | Temp 97.3°F | Resp 20 | Ht 71.0 in | Wt 127.0 lb

## 2019-12-30 DIAGNOSIS — I70229 Atherosclerosis of native arteries of extremities with rest pain, unspecified extremity: Secondary | ICD-10-CM | POA: Diagnosis not present

## 2019-12-30 DIAGNOSIS — I739 Peripheral vascular disease, unspecified: Secondary | ICD-10-CM

## 2019-12-30 NOTE — Telephone Encounter (Signed)
See next phone message
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
## 2019-12-30 NOTE — Telephone Encounter (Signed)
Patient called this morning. Stating he was able to get in with Dr. Edilia Bo this morning and we can cancel request for leg warmers. Patient scheduled for surgery on 01/19/20
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
## 2019-12-31 NOTE — Telephone Encounter (Signed)
This was completed by Cherice yesterday.  Pt set back up with [facility]

## 2020-01-07 ENCOUNTER — Telehealth: Payer: Self-pay | Admitting: Internal Medicine

## 2020-01-07 NOTE — Telephone Encounter (Signed)
Patient states he is scheduled for hip surgery on 01/19/20 and is having pain- would like to know if he can take tylenol for it  Per Dr. Delrae Alfred- pt. To take 1,000 mg of Tylenol a day  Pt. Informed.

## 2020-01-13 ENCOUNTER — Encounter: Payer: Self-pay | Admitting: Internal Medicine

## 2020-01-13 ENCOUNTER — Ambulatory Visit (INDEPENDENT_AMBULATORY_CARE_PROVIDER_SITE_OTHER): Payer: Medicaid Other | Admitting: Internal Medicine

## 2020-01-13 ENCOUNTER — Other Ambulatory Visit: Payer: Self-pay

## 2020-01-13 VITALS — BP 130/76 | HR 88 | Resp 12 | Ht 67.0 in | Wt 123.0 lb

## 2020-01-13 DIAGNOSIS — B351 Tinea unguium: Secondary | ICD-10-CM

## 2020-01-13 DIAGNOSIS — I739 Peripheral vascular disease, unspecified: Secondary | ICD-10-CM | POA: Diagnosis not present

## 2020-01-13 DIAGNOSIS — Z716 Tobacco abuse counseling: Secondary | ICD-10-CM

## 2020-01-13 DIAGNOSIS — L720 Epidermal cyst: Secondary | ICD-10-CM

## 2020-01-13 DIAGNOSIS — I10 Essential (primary) hypertension: Secondary | ICD-10-CM | POA: Diagnosis not present

## 2020-01-13 DIAGNOSIS — M79605 Pain in left leg: Secondary | ICD-10-CM

## 2020-01-13 DIAGNOSIS — M79604 Pain in right leg: Secondary | ICD-10-CM

## 2020-01-13 MED ORDER — OMEPRAZOLE 20 MG PO CPDR: DELAYED_RELEASE_CAPSULE | ORAL | 3 refills | Status: AC

## 2020-01-13 MED ORDER — HYDROCODONE-ACETAMINOPHEN 5-325 MG PO TABS: ORAL_TABLET | ORAL | 0 refills | Status: DC

## 2020-01-13 NOTE — Patient Instructions (Signed)
Stop smoking and get nicotine gum or lozenges and use as we discussed.

## 2020-01-13 NOTE — Progress Notes (Signed)
[id number] - [place] ([place]), [place] - [address] [place] ([place]) [place] [address] Phone: [phone] Fax: [phone]    Your procedure is scheduled on Tuesday, March 30th.  Report to [facility] Main Entrance "A" at 5:30 A.M., and check in at the Admitting office.  Call this number if you have problems the morning of surgery:  [phone]  Call [phone] if you have any questions prior to your surgery date Monday-Friday 8am-4pm   Remember:  Do not eat or drink after midnight the night before your surgery   Take these medicines the morning of surgery with A SIP OF WATER  atorvastatin (LIPITOR)  If needed - acetaminophen (TYLENOL)    Follow your surgeon's instructions on when to stop Aspirin.  If no instructions were given by your surgeon then you will need to call the office to get those instructions.     As of today, all other Aspirin containing products, Vitamins, Fish Oils, and Herbal Medications. Also stop all NSAIDS i.e. Advil, Ibuprofen, Motrin, Aleve, Anaprox, Naproxen, BC, Goody Powders, and all Supplements.   No Smoking of any kind, Tobacco, or Alcohol products 24 hours prior to your procedure. If you use a CPAP at night, you may bring all equipment for your overnight stay.                        Do not wear jewelry.            Do not wear lotions, powders, colognes, or deodorant.            Men may shave face and neck.            Do not bring valuables to the hospital.            [facility] is not responsible for any belongings or valuables.   Contacts, glasses, dentures or bridgework may not be worn into surgery.      For patients admitted to the hospital, discharge time will be determined by your treatment team.   Patients discharged the day of surgery will not be allowed to drive home, and someone needs to stay with them for 24 hours.  Special instructions:   [place]- Preparing For Surgery  Before surgery, you  can play an important role. Because skin is not sterile, your skin needs to be as free of germs as possible. You can reduce the number of germs on your skin by washing with CHG (chlorahexidine gluconate) Soap before surgery.  CHG is an antiseptic cleaner which kills germs and bonds with the skin to continue killing germs even after washing.    Oral Hygiene is also important to reduce your risk of infection.  Remember - BRUSH YOUR TEETH THE MORNING OF SURGERY WITH YOUR REGULAR TOOTHPASTE  Please do not use if you have an allergy to CHG or antibacterial soaps. If your skin becomes reddened/irritated stop using the CHG.  Do not shave (including legs and underarms) for at least 48 hours prior to first CHG shower. It is OK to shave your face.  Please follow these instructions carefully.   1. Shower the NIGHT BEFORE SURGERY and the MORNING OF SURGERY with CHG Soap.   2. If you chose to wash your hair, wash your hair first as usual with your normal shampoo.  3. After you shampoo, rinse your hair and body thoroughly to remove the shampoo.  4. Use CHG as you would any  other liquid soap. You can apply CHG directly to the skin and wash gently with a scrungie or a clean washcloth.   5. Apply the CHG Soap to your body ONLY FROM THE NECK DOWN.  Do not use on open wounds or open sores. Avoid contact with your eyes, ears, mouth and genitals (private parts). Wash Face and genitals (private parts)  with your normal soap.   6. Wash thoroughly, paying special attention to the area where your surgery will be performed.  7. Thoroughly rinse your body with warm water from the neck down.  8. DO NOT shower/wash with your normal soap after using and rinsing off the CHG Soap.  9. Pat yourself dry with a CLEAN TOWEL.  10. Wear CLEAN PAJAMAS to bed the night before surgery, wear comfortable clothes the morning of surgery  11. Place CLEAN SHEETS on your bed the night of your first shower and DO NOT SLEEP WITH  PETS.  Day of Surgery:  Do not apply any deodorants/lotions.  Please wear clean clothes to the hospital/surgery center.   Remember to brush your teeth WITH YOUR REGULAR TOOTHPASTE.  Please read over the following fact sheets that you were given.

## 2020-01-13 NOTE — Progress Notes (Signed)
.    Subjective:    Patient ID: Jaime [name], male   DOB: 12-30-1960, 59 y.o.   MRN: [id number]   HPI   1.  PAD/Claudication:  Back in with [facility] for surgery.  Was a delay as required removal of benign epidermal inclusion cyst of left groin. Scheduled next Tuesday, March 30th for revascularization with Dr. Edilia Bo.   Having a lot of pain in legs currently.  Right leg hurts all the time. His right foot hurts even at rest.  Cannot walk very far without the leg hurting.  Stabbing pain in right lower leg at rest at times.   He continues to smoke 2 cigarettes every other day.  Not attempting to try nicotine gum. He continues on Atorvastatin.  Labs in December showed good response. Preop labs and evaluation is tomorrow.    2.  Lack of appetite/weight loss:  Under a lot of stress taking care of roommate.  States she requires a lot of care, most of which is psychiatric care.  She has support set up that can come at a moment's notice.  Not clear if this is [facility] or not.   She does not bathe or clean up after herself.  Leaves dirty dishes. He gives me her case manager information as feels he needs to move out due to the stress this living is causing him. Early satiety for about 5 months.  No melena or hematochezia.  When he eats, just fills up quickly.   He states his appetite did improve when in the hospital when he was away from her. No swelling or pain of lymph nodes.  No abdominal pain.   Breathing fine.  No chest pain.   Does have reflux symptoms--come and go and only twice monthly.  Generally after drinking something that disagrees with him.   Drinks 2-3 beers in a week's time.  Each 12 oz.    Current Meds  Medication Sig  . acetaminophen (TYLENOL) 500 MG tablet Take 500 mg by mouth every 6 (six) hours as needed for mild pain.  Marland Kitchen aspirin EC 81 MG tablet Take 1 tablet (81 mg total) by mouth daily.  Marland Kitchen atorvastatin (LIPITOR) 10 MG tablet Take 1 tablet (10 mg total) by mouth daily.  Marland Kitchen lisinopril  (ZESTRIL) 5 MG tablet Take 1 tablet by mouth once daily (Patient taking differently: Take 5 mg by mouth daily. )   No Known Allergies   Review of Systems    Objective:   BP 130/76 (BP Location: Left Arm, Patient Position: Sitting, Cuff Size: Normal)   Pulse 88   Resp 12   Ht 5\' 7"  (1.702 m)   Wt 123 lb (55.8 kg)   BMI 19.26 kg/m   Physical Exam NAD HEENT:  PERRL, EOMI Neck:  Supple, No adenopathy, No thyromegaly Chest:  Mildly decreased BS throughout.  Sonorous breathing that resolves after clearing throat.  CTA CV:  RRR without murmur or rub.  Radial pulses normal and equal.  Unable to find DP/PT pulses.  Right toes mildly cool to touch with chronic darkening of toes.   Abd:  S, NT, No HSM or mass, + BS Right low back:  Large open epidermoid cyst:  Curetted thick discharge from lesion as rubbing against belt.  No surrounding erythema or tenderness. LE:  As in CV.  No edema.  Great toenails with thickening, crumbling and discoloration.   Callus build up at medial great toes/MTP joint areas.  Cystic lesion at medial left MTP joint.  Assessment & Plan  1.  PAD:  Has preop labs/evaluation tomorrow--will check out his labs.  Surgery planned for March 30th, next Tuesday.  Will give him 6 doses of Hydrocodone for worsening right leg rest pain for sleep, but discussed no more.  Pain control following surgery as per Dr. Scot Dock. Needs to switch to nicotine gum and completely stop smoking from here on out.  2.  Early satiety/weight loss:  Guaiac cards to return by end of week for guaiac testing.  Expect a CBC with labs tomorrow.  Start Omeprazole to discontinue once stress of living situation and surgery not an issue. Have asked him to move to his friend's home where he believes living arrangements will be less stressful. Will contact caseworker for his roommate to let her know about plans for his roommate's safety and give him relief. [facility]  3.  Epidermoid cyst, right low back:   To keep covered to prevent rubbing and inflammation.  Following surgery, consider removing excess skin and cyst walls and closing so does not reaccumulate.  4.  Tobacco abuse:  Needs to stop use now.  To get rid of supplies for smoking and get nicotine gum.  Discussed how to use appropriately.  5.  Hypertension relative elevation of LDL:  Controlled.    6.  Toenail onychomycosis:  Will address once has better blood supply to LE following surgery.

## 2020-01-14 ENCOUNTER — Encounter (HOSPITAL_COMMUNITY): Payer: Self-pay

## 2020-01-14 ENCOUNTER — Other Ambulatory Visit: Payer: Self-pay

## 2020-01-14 ENCOUNTER — Encounter (HOSPITAL_COMMUNITY)
Admission: RE | Admit: 2020-01-14 | Discharge: 2020-01-14 | Disposition: A | Discharge status: Home / Self Care | Payer: Medicaid Other | Source: Ambulatory Visit | Attending: Vascular Surgery | Admitting: Vascular Surgery

## 2020-01-14 DIAGNOSIS — I10 Essential (primary) hypertension: Secondary | ICD-10-CM | POA: Insufficient documentation

## 2020-01-14 DIAGNOSIS — Z79899 Other long term (current) drug therapy: Secondary | ICD-10-CM | POA: Insufficient documentation

## 2020-01-14 DIAGNOSIS — F329 Major depressive disorder, single episode, unspecified: Secondary | ICD-10-CM | POA: Diagnosis not present

## 2020-01-14 DIAGNOSIS — I739 Peripheral vascular disease, unspecified: Secondary | ICD-10-CM | POA: Diagnosis not present

## 2020-01-14 DIAGNOSIS — Z7901 Long term (current) use of anticoagulants: Secondary | ICD-10-CM | POA: Insufficient documentation

## 2020-01-14 DIAGNOSIS — Z7982 Long term (current) use of aspirin: Secondary | ICD-10-CM | POA: Insufficient documentation

## 2020-01-14 DIAGNOSIS — F17210 Nicotine dependence, cigarettes, uncomplicated: Secondary | ICD-10-CM | POA: Insufficient documentation

## 2020-01-14 DIAGNOSIS — Z01812 Encounter for preprocedural laboratory examination: Secondary | ICD-10-CM | POA: Diagnosis not present

## 2020-01-14 LAB — URINALYSIS, ROUTINE W REFLEX MICROSCOPIC
Bilirubin Urine: NEGATIVE
Glucose, UA: NEGATIVE mg/dL
Hgb urine dipstick: NEGATIVE
Ketones, ur: NEGATIVE mg/dL
Leukocytes,Ua: NEGATIVE
Nitrite: NEGATIVE
Protein, ur: NEGATIVE mg/dL
Specific Gravity, Urine: 1.003 — ABNORMAL LOW (ref 1.005–1.030)
pH: 7 (ref 5.0–8.0)

## 2020-01-14 LAB — CBC
HCT: 39.4 % (ref 39.0–52.0)
Hemoglobin: 13.6 g/dL (ref 13.0–17.0)
MCH: 32.8 pg (ref 26.0–34.0)
MCHC: 34.5 g/dL (ref 30.0–36.0)
MCV: 94.9 fL (ref 80.0–100.0)
Platelets: 207 10*3/uL (ref 150–400)
RBC: 4.15 MIL/uL — ABNORMAL LOW (ref 4.22–5.81)
RDW: 11.5 % (ref 11.5–15.5)
WBC: 5.7 10*3/uL (ref 4.0–10.5)
nRBC: 0 % (ref 0.0–0.2)

## 2020-01-14 LAB — COMPREHENSIVE METABOLIC PANEL
ALT: 51 U/L — ABNORMAL HIGH (ref 0–44)
AST: 52 U/L — ABNORMAL HIGH (ref 15–41)
Albumin: 3.6 g/dL (ref 3.5–5.0)
Alkaline Phosphatase: 75 U/L (ref 38–126)
Anion gap: 9 (ref 5–15)
BUN: <5 mg/dL — ABNORMAL LOW (ref 6–20)
CO2: 24 mmol/L (ref 22–32)
Calcium: 9 mg/dL (ref 8.9–10.3)
Chloride: 92 mmol/L — ABNORMAL LOW (ref 98–111)
Creatinine, Ser: 0.67 mg/dL (ref 0.61–1.24)
GFR calc Af Amer: >60 mL/min (ref >60)
GFR calc non Af Amer: >60 mL/min (ref >60)
Glucose, Bld: 107 mg/dL — ABNORMAL HIGH (ref 70–99)
Potassium: 4.2 mmol/L (ref 3.5–5.1)
Sodium: 125 mmol/L — ABNORMAL LOW (ref 135–145)
Total Bilirubin: 0.9 mg/dL (ref 0.3–1.2)
Total Protein: 7.4 g/dL (ref 6.5–8.1)

## 2020-01-14 LAB — SURGICAL PCR SCREEN
MRSA, PCR: NEGATIVE
Staphylococcus aureus: NEGATIVE

## 2020-01-14 LAB — ABO/RH: ABO/RH(D): O POS

## 2020-01-14 LAB — PROTIME-INR
INR: 0.9 (ref 0.8–1.2)
Prothrombin Time: 12.4 seconds (ref 11.4–15.2)

## 2020-01-14 LAB — APTT: aPTT: 34 seconds (ref 24–36)

## 2020-01-14 MED ORDER — CHLORHEXIDINE GLUCONATE CLOTH 2 % EX PADS: 6.0000 | MEDICATED_PAD | Freq: Once | CUTANEOUS | Status: DC

## 2020-01-14 NOTE — Progress Notes (Addendum)
PCP - Julieanne Manson    Med clearancce Cardiologist - na    Chest x-ray - na EKG - 10/20 Stress Test - na ECHO - na Cardiac Cath -na     Blood Thinner Instructions:na Aspirin Instructions:stop  ERAS Protcol -na PRE-SURGERY Ensure or G2- na  COVID TEST- for the 29th   Anesthesia review: heart hx Patient denies shortness of breath, fever, cough and chest pain at PAT appointment   All instructions explained to the patient, with a verbal understanding of the material. Patient agrees to go over the instructions while at home for a better understanding. Patient also instructed to self quarantine after being tested for COVID-19. The opportunity to ask questions was provided.
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
## 2020-01-18 ENCOUNTER — Other Ambulatory Visit (HOSPITAL_COMMUNITY)
Admission: RE | Admit: 2020-01-18 | Discharge: 2020-01-18 | Disposition: A | Discharge status: Home / Self Care | Payer: Medicaid Other | Source: Ambulatory Visit | Attending: Vascular Surgery | Admitting: Vascular Surgery

## 2020-01-18 LAB — SARS CORONAVIRUS 2 (TAT 6-24 HRS): SARS Coronavirus 2: NEGATIVE

## 2020-01-18 MED ORDER — SODIUM CHLORIDE 0.9 % IV SOLN: INTRAVENOUS | Status: DC

## 2020-01-18 MED ORDER — DEXTROSE 5 % IV SOLN: 2.0000 g | INTRAVENOUS | Status: DC

## 2020-01-18 NOTE — Addendum Note (Signed)
Addended by: Julious Payer C on: 01/18/2020 11:26 AM   Modules accepted: Orders, SmartSet

## 2020-01-18 NOTE — Anesthesia Preprocedure Evaluation (Addendum)
Anesthesia Evaluation  Patient identified by MRN, date of birth, ID band Patient awake    Reviewed: Allergy & Precautions, H&P , NPO status , Patient's Chart, lab work & pertinent test results  History of Anesthesia Complications Negative for: history of anesthetic complications  Airway Mallampati: II  TM Distance: >3 FB Neck ROM: Full    Dental  (+) Poor Dentition, Dental Advisory Given   Pulmonary neg pulmonary ROS, Current SmokerPatient did not abstain from smoking.,    Pulmonary exam normal        Cardiovascular Exercise Tolerance: Good hypertension, Pt. on medications + Peripheral Vascular Disease  Normal cardiovascular exam     Neuro/Psych PSYCHIATRIC DISORDERS Depression  Neuromuscular disease negative neurological ROS  negative psych ROS   GI/Hepatic negative GI ROS, Neg liver ROS,   Endo/Other  negative endocrine ROS  Renal/GU negative Renal ROS  negative genitourinary   Musculoskeletal negative musculoskeletal ROS (+)   Abdominal   Peds negative pediatric ROS (+)  Hematology negative hematology ROS (+)   Anesthesia Other Findings   Reproductive/Obstetrics negative OB ROS                            Anesthesia Physical  Anesthesia Plan  ASA: III  Anesthesia Plan: General   Post-op Pain Management:    Induction: Intravenous  PONV Risk Score and Plan: 3 and Ondansetron, Dexamethasone and Midazolam  Airway Management Planned: Oral ETT  Additional Equipment: Arterial line  Intra-op Plan:   Post-operative Plan: Extubation in OR  Informed Consent: I have reviewed the patients History and Physical, chart, labs and discussed the procedure including the risks, benefits and alternatives for the proposed anesthesia with the patient or authorized representative who has indicated his/her understanding and acceptance.     Dental advisory given  Plan Discussed with:  Anesthesiologist and CRNA  Anesthesia Plan Comments: (Left side aline )       Anesthesia Quick Evaluation

## 2020-01-18 NOTE — Anesthesia Preprocedure Evaluation (Deleted)
Anesthesia Evaluation    Airway        Dental   Pulmonary Current Smoker,           Cardiovascular hypertension,      Neuro/Psych    GI/Hepatic   Endo/Other    Renal/GU      Musculoskeletal   Abdominal   Peds  Hematology   Anesthesia Other Findings   Reproductive/Obstetrics                             Anesthesia Physical Anesthesia Plan  ASA:   Anesthesia Plan:    Post-op Pain Management:    Induction:   PONV Risk Score and Plan:   Airway Management Planned:   Additional Equipment:   Intra-op Plan:   Post-operative Plan:   Informed Consent:   Plan Discussed with:   Anesthesia Plan Comments: (See PAT note written by Shonna Chock, PA-C. He is for STAT BMET on arrival to re-evaluate Na.  )        Anesthesia Quick Evaluation

## 2020-01-19 ENCOUNTER — Encounter (HOSPITAL_COMMUNITY)
Admission: RE | Disposition: A | Discharge status: Home / Self Care | Payer: Self-pay | Source: Ambulatory Visit | Attending: Vascular Surgery

## 2020-01-19 ENCOUNTER — Encounter (HOSPITAL_COMMUNITY): Payer: Self-pay | Admitting: Vascular Surgery

## 2020-01-19 ENCOUNTER — Other Ambulatory Visit: Payer: Self-pay

## 2020-01-19 ENCOUNTER — Inpatient Hospital Stay (HOSPITAL_COMMUNITY)
Admission: RE | Admit: 2020-01-19 | Discharge: 2020-01-21 | DRG: 271 | Disposition: A | Discharge status: Home / Self Care | Payer: Medicaid Other | Source: Ambulatory Visit | Attending: Vascular Surgery | Admitting: Vascular Surgery

## 2020-01-19 ENCOUNTER — Inpatient Hospital Stay (HOSPITAL_COMMUNITY): Payer: Medicaid Other | Admitting: Emergency Medicine

## 2020-01-19 ENCOUNTER — Inpatient Hospital Stay (HOSPITAL_COMMUNITY): Payer: Medicaid Other | Admitting: Vascular Surgery

## 2020-01-19 DIAGNOSIS — N5089 Other specified disorders of the male genital organs: Secondary | ICD-10-CM | POA: Diagnosis present

## 2020-01-19 DIAGNOSIS — I998 Other disorder of circulatory system: Secondary | ICD-10-CM | POA: Diagnosis present

## 2020-01-19 DIAGNOSIS — Z7982 Long term (current) use of aspirin: Secondary | ICD-10-CM | POA: Diagnosis not present

## 2020-01-19 DIAGNOSIS — Z79899 Other long term (current) drug therapy: Secondary | ICD-10-CM

## 2020-01-19 DIAGNOSIS — I70223 Atherosclerosis of native arteries of extremities with rest pain, bilateral legs: Secondary | ICD-10-CM | POA: Diagnosis present

## 2020-01-19 DIAGNOSIS — N400 Enlarged prostate without lower urinary tract symptoms: Secondary | ICD-10-CM | POA: Diagnosis present

## 2020-01-19 DIAGNOSIS — I10 Essential (primary) hypertension: Secondary | ICD-10-CM | POA: Diagnosis present

## 2020-01-19 DIAGNOSIS — I70221 Atherosclerosis of native arteries of extremities with rest pain, right leg: Secondary | ICD-10-CM | POA: Diagnosis not present

## 2020-01-19 DIAGNOSIS — D62 Acute posthemorrhagic anemia: Secondary | ICD-10-CM | POA: Diagnosis not present

## 2020-01-19 DIAGNOSIS — F17210 Nicotine dependence, cigarettes, uncomplicated: Secondary | ICD-10-CM | POA: Diagnosis present

## 2020-01-19 DIAGNOSIS — I739 Peripheral vascular disease, unspecified: Secondary | ICD-10-CM | POA: Diagnosis present

## 2020-01-19 DIAGNOSIS — Z20822 Contact with and (suspected) exposure to covid-19: Secondary | ICD-10-CM | POA: Diagnosis present

## 2020-01-19 DIAGNOSIS — E43 Unspecified severe protein-calorie malnutrition: Secondary | ICD-10-CM | POA: Insufficient documentation

## 2020-01-19 HISTORY — PX: BYPASS GRAFT: SHX909

## 2020-01-19 HISTORY — PX: AXILLARY-FEMORAL BYPASS GRAFT: SHX894

## 2020-01-19 LAB — POCT I-STAT 7, (LYTES, BLD GAS, ICA,H+H)
Acid-base deficit: 2 mmol/L (ref 0.0–2.0)
Acid-base deficit: 2 mmol/L (ref 0.0–2.0)
Bicarbonate: 22.2 mmol/L (ref 20.0–28.0)
Bicarbonate: 23.9 mmol/L (ref 20.0–28.0)
Calcium, Ion: 1.17 mmol/L (ref 1.15–1.40)
Calcium, Ion: 1.13 mmol/L — ABNORMAL LOW (ref 1.15–1.40)
HCT: 29 % — ABNORMAL LOW (ref 39.0–52.0)
HCT: 23 % — ABNORMAL LOW (ref 39.0–52.0)
Hemoglobin: 9.9 g/dL — ABNORMAL LOW (ref 13.0–17.0)
Hemoglobin: 7.8 g/dL — ABNORMAL LOW (ref 13.0–17.0)
O2 Saturation: 100 %
O2 Saturation: 100 %
Patient temperature: 35.1
Potassium: 3.3 mmol/L — ABNORMAL LOW (ref 3.5–5.1)
Potassium: 4.3 mmol/L (ref 3.5–5.1)
Sodium: 128 mmol/L — ABNORMAL LOW (ref 135–145)
Sodium: 128 mmol/L — ABNORMAL LOW (ref 135–145)
TCO2: 23 mmol/L (ref 22–32)
TCO2: 25 mmol/L (ref 22–32)
pCO2 arterial: 36.3 mmHg (ref 32.0–48.0)
pCO2 arterial: 40.5 mmHg (ref 32.0–48.0)
pH, Arterial: 7.394 (ref 7.350–7.450)
pH, Arterial: 7.37 (ref 7.350–7.450)
pO2, Arterial: 338 mmHg — ABNORMAL HIGH (ref 83.0–108.0)
pO2, Arterial: 309 mmHg — ABNORMAL HIGH (ref 83.0–108.0)

## 2020-01-19 LAB — CBC WITH DIFFERENTIAL/PLATELET
Abs Immature Granulocytes: 0.06 10*3/uL (ref 0.00–0.07)
Basophils Absolute: 0 10*3/uL (ref 0.0–0.1)
Basophils Relative: 0 %
Eosinophils Absolute: 0 10*3/uL (ref 0.0–0.5)
Eosinophils Relative: 0 %
HCT: 22.3 % — ABNORMAL LOW (ref 39.0–52.0)
Hemoglobin: 7.7 g/dL — ABNORMAL LOW (ref 13.0–17.0)
Immature Granulocytes: 1 %
Lymphocytes Relative: 10 %
Lymphs Abs: 1.2 10*3/uL (ref 0.7–4.0)
MCH: 30.8 pg (ref 26.0–34.0)
MCHC: 34.5 g/dL (ref 30.0–36.0)
MCV: 89.2 fL (ref 80.0–100.0)
Monocytes Absolute: 0.7 10*3/uL (ref 0.1–1.0)
Monocytes Relative: 6 %
Neutro Abs: 10.1 10*3/uL — ABNORMAL HIGH (ref 1.7–7.7)
Neutrophils Relative %: 83 %
Platelets: 150 10*3/uL (ref 150–400)
RBC: 2.5 MIL/uL — ABNORMAL LOW (ref 4.22–5.81)
RDW: 17.2 % — ABNORMAL HIGH (ref 11.5–15.5)
WBC: 12 10*3/uL — ABNORMAL HIGH (ref 4.0–10.5)
nRBC: 0 % (ref 0.0–0.2)

## 2020-01-19 LAB — CBC
HCT: 17.6 % — ABNORMAL LOW (ref 39.0–52.0)
Hemoglobin: 6 g/dL — CL (ref 13.0–17.0)
MCH: 33.1 pg (ref 26.0–34.0)
MCHC: 34.1 g/dL (ref 30.0–36.0)
MCV: 97.2 fL (ref 80.0–100.0)
Platelets: 187 10*3/uL (ref 150–400)
RBC: 1.81 MIL/uL — ABNORMAL LOW (ref 4.22–5.81)
RDW: 11.9 % (ref 11.5–15.5)
WBC: 7.9 10*3/uL (ref 4.0–10.5)
nRBC: 0 % (ref 0.0–0.2)

## 2020-01-19 LAB — BASIC METABOLIC PANEL
Anion gap: 11 (ref 5–15)
BUN: <5 mg/dL — ABNORMAL LOW (ref 6–20)
CO2: 24 mmol/L (ref 22–32)
Calcium: 9.3 mg/dL (ref 8.9–10.3)
Chloride: 91 mmol/L — ABNORMAL LOW (ref 98–111)
Creatinine, Ser: 0.68 mg/dL (ref 0.61–1.24)
GFR calc Af Amer: >60 mL/min (ref >60)
GFR calc non Af Amer: >60 mL/min (ref >60)
Glucose, Bld: 98 mg/dL (ref 70–99)
Potassium: 4.2 mmol/L (ref 3.5–5.1)
Sodium: 126 mmol/L — ABNORMAL LOW (ref 135–145)

## 2020-01-19 LAB — CREATININE, SERUM
Creatinine, Ser: 0.65 mg/dL (ref 0.61–1.24)
GFR calc Af Amer: >60 mL/min (ref >60)
GFR calc non Af Amer: >60 mL/min (ref >60)

## 2020-01-19 LAB — PREPARE RBC (CROSSMATCH)

## 2020-01-19 SURGERY — CREATION, BYPASS, ARTERIAL, AXILLARY TO BILATERAL FEMORAL, USING GRAFT
Anesthesia: General | Laterality: Right

## 2020-01-19 MED ORDER — SODIUM CHLORIDE 0.9% IV SOLUTION: Freq: Once | INTRAVENOUS | Status: AC

## 2020-01-19 MED ORDER — CHLORHEXIDINE GLUCONATE CLOTH 2 % EX PADS: 6.0000 | MEDICATED_PAD | Freq: Once | CUTANEOUS | Status: DC

## 2020-01-19 MED ORDER — PHENYLEPHRINE 40 MCG/ML (10ML) SYRINGE FOR IV PUSH (FOR BLOOD PRESSURE SUPPORT)
PREFILLED_SYRINGE | INTRAVENOUS | Status: AC
  Filled 2020-01-19: qty 20

## 2020-01-19 MED ORDER — PROTAMINE SULFATE 10 MG/ML IV SOLN
INTRAVENOUS | Status: AC
  Filled 2020-01-19: qty 25

## 2020-01-19 MED ORDER — ALBUMIN HUMAN 5 % IV SOLN: INTRAVENOUS | Status: DC | PRN

## 2020-01-19 MED ORDER — FENTANYL CITRATE (PF) 100 MCG/2ML IJ SOLN
INTRAMUSCULAR | Status: DC | PRN
  Administered 2020-01-19 (×9): 50 ug via INTRAVENOUS
  Administered 2020-01-19: 100 ug via INTRAVENOUS

## 2020-01-19 MED ORDER — ACETAMINOPHEN 325 MG PO TABS
325.0000 mg | ORAL_TABLET | ORAL | Status: DC | PRN
  Administered 2020-01-20 – 2020-01-21 (×5): 650 mg via ORAL
  Filled 2020-01-19 (×5): qty 2

## 2020-01-19 MED ORDER — PROPOFOL 10 MG/ML IV BOLUS
INTRAVENOUS | Status: AC
  Filled 2020-01-19: qty 20

## 2020-01-19 MED ORDER — ROCURONIUM BROMIDE 10 MG/ML (PF) SYRINGE
PREFILLED_SYRINGE | INTRAVENOUS | Status: AC
  Filled 2020-01-19: qty 10

## 2020-01-19 MED ORDER — HEPARIN SODIUM (PORCINE) 1000 UNIT/ML IJ SOLN
INTRAMUSCULAR | Status: AC
  Filled 2020-01-19: qty 2

## 2020-01-19 MED ORDER — PANTOPRAZOLE SODIUM 40 MG PO TBEC
40.0000 mg | DELAYED_RELEASE_TABLET | Freq: Every day | ORAL | Status: DC
  Administered 2020-01-19 – 2020-01-20 (×2): 40 mg via ORAL
  Filled 2020-01-19 (×2): qty 1

## 2020-01-19 MED ORDER — SODIUM CHLORIDE 0.9 % IV SOLN: INTRAVENOUS | Status: DC

## 2020-01-19 MED ORDER — POTASSIUM CHLORIDE CRYS ER 20 MEQ PO TBCR: 20.0000 meq | EXTENDED_RELEASE_TABLET | Freq: Every day | ORAL | Status: DC | PRN

## 2020-01-19 MED ORDER — METOPROLOL TARTRATE 5 MG/5ML IV SOLN: 2.0000 mg | INTRAVENOUS | Status: DC | PRN

## 2020-01-19 MED ORDER — CEFAZOLIN SODIUM-DEXTROSE 2-4 GM/100ML-% IV SOLN
2.0000 g | Freq: Three times a day (TID) | INTRAVENOUS | Status: AC
  Administered 2020-01-19 – 2020-01-20 (×2): 2 g via INTRAVENOUS
  Filled 2020-01-19 (×2): qty 100

## 2020-01-19 MED ORDER — PROTAMINE SULFATE 10 MG/ML IV SOLN
INTRAVENOUS | Status: DC | PRN
  Administered 2020-01-19 (×5): 10 mg via INTRAVENOUS

## 2020-01-19 MED ORDER — ACETAMINOPHEN 500 MG PO TABS
1000.0000 mg | ORAL_TABLET | Freq: Once | ORAL | Status: AC
  Administered 2020-01-19: 1000 mg via ORAL
  Filled 2020-01-19: qty 2

## 2020-01-19 MED ORDER — CHLORHEXIDINE GLUCONATE CLOTH 2 % EX PADS
6.0000 | MEDICATED_PAD | Freq: Every day | CUTANEOUS | Status: DC
  Administered 2020-01-19: 18:00:00 6 via TOPICAL

## 2020-01-19 MED ORDER — SODIUM CHLORIDE 0.9 % IV SOLN: INTRAVENOUS | Status: AC

## 2020-01-19 MED ORDER — CEFAZOLIN SODIUM-DEXTROSE 2-4 GM/100ML-% IV SOLN
INTRAVENOUS | Status: AC
  Filled 2020-01-19: qty 100

## 2020-01-19 MED ORDER — LACTATED RINGERS IV SOLN: INTRAVENOUS | Status: DC | PRN

## 2020-01-19 MED ORDER — HYDRALAZINE HCL 20 MG/ML IJ SOLN
5.0000 mg | INTRAMUSCULAR | Status: DC | PRN
  Filled 2020-01-19: qty 0.25

## 2020-01-19 MED ORDER — ESMOLOL HCL 100 MG/10ML IV SOLN
INTRAVENOUS | Status: DC | PRN
  Administered 2020-01-19: 30 mg via INTRAVENOUS

## 2020-01-19 MED ORDER — DEXAMETHASONE SODIUM PHOSPHATE 10 MG/ML IJ SOLN
INTRAMUSCULAR | Status: DC | PRN
  Administered 2020-01-19: 10 mg via INTRAVENOUS

## 2020-01-19 MED ORDER — OXYCODONE-ACETAMINOPHEN 5-325 MG PO TABS
1.0000 | ORAL_TABLET | ORAL | Status: DC | PRN
  Filled 2020-01-19: qty 1

## 2020-01-19 MED ORDER — FENTANYL CITRATE (PF) 250 MCG/5ML IJ SOLN
INTRAMUSCULAR | Status: AC
  Filled 2020-01-19: qty 5

## 2020-01-19 MED ORDER — ONDANSETRON HCL 4 MG/2ML IJ SOLN: 4.0000 mg | Freq: Four times a day (QID) | INTRAMUSCULAR | Status: DC | PRN

## 2020-01-19 MED ORDER — GUAIFENESIN-DM 100-10 MG/5ML PO SYRP: 15.0000 mL | ORAL_SOLUTION | ORAL | Status: DC | PRN

## 2020-01-19 MED ORDER — SODIUM CHLORIDE 0.9 % IV SOLN
INTRAVENOUS | Status: AC
  Filled 2020-01-19: qty 1.2

## 2020-01-19 MED ORDER — FENTANYL CITRATE (PF) 100 MCG/2ML IJ SOLN: 25.0000 ug | INTRAMUSCULAR | Status: DC | PRN

## 2020-01-19 MED ORDER — PHENYLEPHRINE HCL-NACL 10-0.9 MG/250ML-% IV SOLN
INTRAVENOUS | Status: DC | PRN
  Administered 2020-01-19: 50 ug/min via INTRAVENOUS

## 2020-01-19 MED ORDER — PHENYLEPHRINE 40 MCG/ML (10ML) SYRINGE FOR IV PUSH (FOR BLOOD PRESSURE SUPPORT)
PREFILLED_SYRINGE | INTRAVENOUS | Status: DC | PRN
  Administered 2020-01-19: 80 ug via INTRAVENOUS
  Administered 2020-01-19: 40 ug via INTRAVENOUS
  Administered 2020-01-19 (×4): 80 ug via INTRAVENOUS

## 2020-01-19 MED ORDER — PROPOFOL 10 MG/ML IV BOLUS
INTRAVENOUS | Status: DC | PRN
  Administered 2020-01-19: 140 mg via INTRAVENOUS

## 2020-01-19 MED ORDER — MAGNESIUM SULFATE 2 GM/50ML IV SOLN: 2.0000 g | Freq: Every day | INTRAVENOUS | Status: DC | PRN

## 2020-01-19 MED ORDER — CEFAZOLIN SODIUM-DEXTROSE 2-3 GM-%(50ML) IV SOLR
INTRAVENOUS | Status: DC | PRN
  Administered 2020-01-19 (×2): 2 g via INTRAVENOUS

## 2020-01-19 MED ORDER — LABETALOL HCL 5 MG/ML IV SOLN
10.0000 mg | INTRAVENOUS | Status: DC | PRN
  Filled 2020-01-19: qty 4

## 2020-01-19 MED ORDER — 0.9 % SODIUM CHLORIDE (POUR BTL) OPTIME
TOPICAL | Status: DC | PRN
  Administered 2020-01-19: 2000 mL

## 2020-01-19 MED ORDER — HEPARIN SODIUM (PORCINE) 5000 UNIT/ML IJ SOLN
5000.0000 [IU] | Freq: Three times a day (TID) | INTRAMUSCULAR | Status: DC
  Administered 2020-01-20 – 2020-01-21 (×4): 5000 [IU] via SUBCUTANEOUS
  Filled 2020-01-19 (×3): qty 1

## 2020-01-19 MED ORDER — ROCURONIUM BROMIDE 50 MG/5ML IV SOSY
PREFILLED_SYRINGE | INTRAVENOUS | Status: DC | PRN
  Administered 2020-01-19: 100 mg via INTRAVENOUS
  Administered 2020-01-19: 50 mg via INTRAVENOUS

## 2020-01-19 MED ORDER — ATORVASTATIN CALCIUM 10 MG PO TABS
10.0000 mg | ORAL_TABLET | Freq: Every day | ORAL | Status: DC
  Administered 2020-01-19 – 2020-01-20 (×2): 10 mg via ORAL
  Filled 2020-01-19 (×2): qty 1

## 2020-01-19 MED ORDER — THROMBIN (RECOMBINANT) 20000 UNITS EX SOLR
CUTANEOUS | Status: AC
  Filled 2020-01-19: qty 20000

## 2020-01-19 MED ORDER — LIDOCAINE 2% (20 MG/ML) 5 ML SYRINGE
INTRAMUSCULAR | Status: AC
  Filled 2020-01-19: qty 5

## 2020-01-19 MED ORDER — CELECOXIB 200 MG PO CAPS
200.0000 mg | ORAL_CAPSULE | Freq: Once | ORAL | Status: AC
  Administered 2020-01-19: 200 mg via ORAL
  Filled 2020-01-19: qty 1

## 2020-01-19 MED ORDER — PHENOL 1.4 % MT LIQD: 1.0000 | OROMUCOSAL | Status: DC | PRN

## 2020-01-19 MED ORDER — SODIUM CHLORIDE 0.9 % IV SOLN
INTRAVENOUS | Status: DC | PRN
  Administered 2020-01-19: 500 mL

## 2020-01-19 MED ORDER — MORPHINE SULFATE (PF) 2 MG/ML IV SOLN
2.0000 mg | INTRAVENOUS | Status: DC | PRN
  Filled 2020-01-19: qty 1

## 2020-01-19 MED ORDER — SODIUM CHLORIDE 0.9 % IV SOLN: 500.0000 mL | Freq: Once | INTRAVENOUS | Status: DC | PRN

## 2020-01-19 MED ORDER — LISINOPRIL 5 MG PO TABS
5.0000 mg | ORAL_TABLET | Freq: Every day | ORAL | Status: DC
  Administered 2020-01-21: 10:00:00 5 mg via ORAL
  Filled 2020-01-19: qty 1

## 2020-01-19 MED ORDER — MIDAZOLAM HCL 5 MG/5ML IJ SOLN
INTRAMUSCULAR | Status: DC | PRN
  Administered 2020-01-19: 2 mg via INTRAVENOUS

## 2020-01-19 MED ORDER — SUGAMMADEX SODIUM 200 MG/2ML IV SOLN
INTRAVENOUS | Status: DC | PRN
  Administered 2020-01-19: 120 mg via INTRAVENOUS

## 2020-01-19 MED ORDER — DOCUSATE SODIUM 100 MG PO CAPS
100.0000 mg | ORAL_CAPSULE | Freq: Every day | ORAL | Status: DC
  Filled 2020-01-19: qty 1

## 2020-01-19 MED ORDER — LIDOCAINE 2% (20 MG/ML) 5 ML SYRINGE
INTRAMUSCULAR | Status: DC | PRN
  Administered 2020-01-19: 100 mg via INTRAVENOUS

## 2020-01-19 MED ORDER — ACETAMINOPHEN 325 MG RE SUPP: 325.0000 mg | RECTAL | Status: DC | PRN

## 2020-01-19 MED ORDER — MIDAZOLAM HCL 2 MG/2ML IJ SOLN
INTRAMUSCULAR | Status: AC
  Filled 2020-01-19: qty 2

## 2020-01-19 MED ORDER — ASPIRIN EC 81 MG PO TBEC
81.0000 mg | DELAYED_RELEASE_TABLET | Freq: Every day | ORAL | Status: DC
  Administered 2020-01-19 – 2020-01-21 (×3): 81 mg via ORAL
  Filled 2020-01-19 (×3): qty 1

## 2020-01-19 MED ORDER — HEMOSTATIC AGENTS (NO CHARGE) OPTIME
TOPICAL | Status: DC | PRN
  Administered 2020-01-19: 1 via TOPICAL

## 2020-01-19 MED ORDER — HEPARIN SODIUM (PORCINE) 1000 UNIT/ML IJ SOLN
INTRAMUSCULAR | Status: DC | PRN
  Administered 2020-01-19: 1000 [IU] via INTRAVENOUS
  Administered 2020-01-19: 6000 [IU] via INTRAVENOUS

## 2020-01-19 MED ORDER — PROMETHAZINE HCL 25 MG/ML IJ SOLN: 6.2500 mg | INTRAMUSCULAR | Status: DC | PRN

## 2020-01-19 MED ORDER — ALUM & MAG HYDROXIDE-SIMETH 200-200-20 MG/5ML PO SUSP: 15.0000 mL | ORAL | Status: DC | PRN

## 2020-01-19 SURGICAL SUPPLY — 59 items
BAG ISOLATION DRAPE 18X18 (DRAPES) ×2 IMPLANT
CANISTER SUCT 3000ML PPV (MISCELLANEOUS) ×3 IMPLANT
CANNULA VESSEL 3MM 2 BLNT TIP (CANNULA) ×6 IMPLANT
CLIP VESOCCLUDE MED 24/CT (CLIP) ×3 IMPLANT
CLIP VESOCCLUDE SM WIDE 24/CT (CLIP) ×3 IMPLANT
DERMABOND ADVANCED (GAUZE/BANDAGES/DRESSINGS) ×10
DERMABOND ADVANCED .7 DNX12 (GAUZE/BANDAGES/DRESSINGS) ×5 IMPLANT
DRAIN SNY 10X20 3/4 PERF (WOUND CARE) IMPLANT
DRAPE INCISE IOBAN 66X45 STRL (DRAPES) ×3 IMPLANT
DRAPE ISOLATION BAG 18X18 (DRAPES) ×4
ELECT REM PT RETURN 9FT ADLT (ELECTROSURGICAL) ×6
ELECTRODE REM PT RTRN 9FT ADLT (ELECTROSURGICAL) ×2 IMPLANT
EVACUATOR SILICONE 100CC (DRAIN) IMPLANT
GAUZE 4X4 16PLY RFD (DISPOSABLE) ×3 IMPLANT
GLOVE BIO SURGEON STRL SZ 6.5 (GLOVE) ×2 IMPLANT
GLOVE BIO SURGEON STRL SZ7.5 (GLOVE) ×6 IMPLANT
GLOVE BIO SURGEONS STRL SZ 6.5 (GLOVE) ×1
GLOVE BIOGEL PI IND STRL 6.5 (GLOVE) ×7 IMPLANT
GLOVE BIOGEL PI IND STRL 8 (GLOVE) ×1 IMPLANT
GLOVE BIOGEL PI INDICATOR 6.5 (GLOVE) ×14
GLOVE BIOGEL PI INDICATOR 8 (GLOVE) ×2
GLOVE ECLIPSE 8.0 STRL XLNG CF (GLOVE) ×9 IMPLANT
GLOVE SURG SS PI 6.5 STRL IVOR (GLOVE) ×6 IMPLANT
GOWN STRL REUS W/ TWL LRG LVL3 (GOWN DISPOSABLE) ×5 IMPLANT
GOWN STRL REUS W/ TWL XL LVL3 (GOWN DISPOSABLE) ×1 IMPLANT
GOWN STRL REUS W/TWL 2XL LVL3 (GOWN DISPOSABLE) ×9 IMPLANT
GOWN STRL REUS W/TWL LRG LVL3 (GOWN DISPOSABLE) ×10
GOWN STRL REUS W/TWL XL LVL3 (GOWN DISPOSABLE) ×2
GRAFT PROPATEN W/RING 8X80X70 (Vascular Products) ×3 IMPLANT
GRAFT VASC STRETCH 8X40 (Graft) ×3 IMPLANT
HEMOSTAT HEMOBLAST BELLOWS (HEMOSTASIS) ×3 IMPLANT
INSERT FOGARTY SM (MISCELLANEOUS) IMPLANT
KIT BASIN OR (CUSTOM PROCEDURE TRAY) ×3 IMPLANT
KIT TURNOVER KIT B (KITS) ×3 IMPLANT
LOOP VESSEL MAXI BLUE (MISCELLANEOUS) ×3 IMPLANT
LOOP VESSEL MINI RED (MISCELLANEOUS) ×6 IMPLANT
NS IRRIG 1000ML POUR BTL (IV SOLUTION) ×6 IMPLANT
PACK PERIPHERAL VASCULAR (CUSTOM PROCEDURE TRAY) ×3 IMPLANT
PAD ARMBOARD 7.5X6 YLW CONV (MISCELLANEOUS) ×6 IMPLANT
SPONGE LAP 18X18 RF (DISPOSABLE) ×9 IMPLANT
SPONGE SURGIFOAM ABS GEL 100 (HEMOSTASIS) IMPLANT
SUT GORETEX 6.0 TT9 (SUTURE) ×9 IMPLANT
SUT PROLENE 5 0 C 1 24 (SUTURE) ×6 IMPLANT
SUT PROLENE 6 0 BV (SUTURE) ×21 IMPLANT
SUT SILK 2 0 (SUTURE) ×2
SUT SILK 2 0 PERMA HAND 18 BK (SUTURE) ×6 IMPLANT
SUT SILK 2-0 18XBRD TIE 12 (SUTURE) ×1 IMPLANT
SUT SILK 3 0 (SUTURE) ×6
SUT SILK 3-0 18XBRD TIE 12 (SUTURE) ×3 IMPLANT
SUT SILK 4 0 (SUTURE) ×2
SUT SILK 4-0 18XBRD TIE 12 (SUTURE) ×1 IMPLANT
SUT VIC AB 2-0 CTB1 (SUTURE) ×9 IMPLANT
SUT VIC AB 3-0 SH 27 (SUTURE) ×6
SUT VIC AB 3-0 SH 27X BRD (SUTURE) ×3 IMPLANT
SUT VIC AB 4-0 PS2 18 (SUTURE) ×6 IMPLANT
SUT VICRYL 4-0 PS2 18IN ABS (SUTURE) ×6 IMPLANT
TOWEL GREEN STERILE (TOWEL DISPOSABLE) ×6 IMPLANT
TRAY FOLEY MTR SLVR 16FR STAT (SET/KITS/TRAYS/PACK) ×3 IMPLANT
WATER STERILE IRR 1000ML POUR (IV SOLUTION) ×3 IMPLANT

## 2020-01-19 NOTE — Progress Notes (Signed)
   VASCULAR SURGERY POSTOP:   Doing well.  Good Doppler signals both feet. He has known infrainguinal arterial occlusive disease bilaterally.  Receiving 2 units for acute blood loss anemia. He had significant bleeding from the needle holes related to his Gore-Tex graft. Follow-up CBC tomorrow.   SUBJECTIVE:   No specific complaints.  PHYSICAL EXAM:   Vitals:   01/19/20 1448 01/19/20 1452 01/19/20 1504 01/19/20 1522  BP: 105/67 107/65 108/62 104/66  Pulse: 90 92 96 (!) 105  Resp: (!) 24 19 20 18   Temp: (!) 97.3 F (36.3 C)  97.9 F (36.6 C) (!) 97.5 F (36.4 C)  TempSrc:    Oral  SpO2:  100% 100% 100%  Weight:      Height:       Good Doppler flow in the posterior tibial and dorsalis pedis positions bilaterally. His incisions look fine.  LABS:   Lab Results  Component Value Date   WBC 7.9 01/19/2020   HGB 6.0 (LL) 01/19/2020   HCT 17.6 (L) 01/19/2020   MCV 97.2 01/19/2020   PLT 187 01/19/2020   Lab Results  Component Value Date   CREATININE 0.65 01/19/2020   Lab Results  Component Value Date   INR 0.9 01/14/2020    PROBLEM LIST:    Active Problems:   PAD (peripheral artery disease) (HCC)   CURRENT MEDS:   . sodium chloride   Intravenous Once  . aspirin EC  81 mg Oral Daily  . atorvastatin  10 mg Oral Daily  . [START ON 01/20/2020] docusate sodium  100 mg Oral Daily  . [START ON 01/20/2020] heparin  5,000 Units Subcutaneous Q8H  . [START ON 01/21/2020] lisinopril  5 mg Oral Daily  . pantoprazole  40 mg Oral Daily    03/22/2020 Office: [phone] 01/19/2020

## 2020-01-19 NOTE — Interval H&P Note (Signed)
History and Physical Interval Note:  01/19/2020 7:10 AM  Jaime [name]  has presented today for surgery, with the diagnosis of PERIPHERAL ARTERY DISEASE.  The various methods of treatment have been discussed with the patient and family. After consideration of risks, benefits and other options for treatment, the patient has consented to  Procedure(s): BYPASS GRAFT AXILLA-BIFEMORAL (Right) as a surgical intervention.  The patient's history has been reviewed, patient examined, no change in status, stable for surgery.  I have reviewed the patient's chart and labs.  Questions were answered to the patient's satisfaction.     Waverly Ferrari

## 2020-01-19 NOTE — Anesthesia Procedure Notes (Signed)
Procedure Name: Intubation Date/Time: 01/19/2020 7:47 AM Performed by: Rosiland Oz, CRNA Pre-anesthesia Checklist: Patient identified, Emergency Drugs available, Suction available, Patient being monitored and Timeout performed Patient Re-evaluated:Patient Re-evaluated prior to induction Oxygen Delivery Method: Circle system utilized Preoxygenation: Pre-oxygenation with 100% oxygen Induction Type: IV induction Ventilation: Mask ventilation without difficulty Laryngoscope Size: Miller and 3 Grade View: Grade I Tube type: Oral Tube size: 7.5 mm Number of attempts: 1 Airway Equipment and Method: Stylet Placement Confirmation: ETT inserted through vocal cords under direct vision,  positive ETCO2 and breath sounds checked- equal and bilateral Secured at: 21 cm Tube secured with: Tape Dental Injury: Teeth and Oropharynx as per pre-operative assessment

## 2020-01-19 NOTE — Transfer of Care (Signed)
Immediate Anesthesia Transfer of Care Note  Patient: Dray Dorman  Procedure(s) Performed: BYPASS GRAFT AXILLA-BIFEMORAL USING GORETEX GRAFTS (Right )  Patient Location: PACU  Anesthesia Type:General  Level of Consciousness: drowsy and patient cooperative  Airway & Oxygen Therapy: Patient Spontanous Breathing  Post-op Assessment: Report given to RN and Post -op Vital signs reviewed and stable  Post vital signs: Reviewed and stable  Last Vitals:  Vitals Value Taken Time  BP 134/65 01/19/20 1253  Temp    Pulse 94 01/19/20 1256  Resp 21 01/19/20 1256  SpO2 100 % 01/19/20 1256  Vitals shown include unvalidated device data.  Last Pain:  Vitals:   01/19/20 0625  TempSrc:   PainSc: 5       Patients Stated Pain Goal: 3 (01/19/20 8004)  Complications: No apparent anesthesia complications

## 2020-01-19 NOTE — Anesthesia Procedure Notes (Signed)
Arterial Line Insertion Start/End3/30/2021 7:15 AM, 01/19/2020 7:25 AM Performed by: Rosiland Oz, CRNA, CRNA  Patient location: Pre-op. Preanesthetic checklist: patient identified, IV checked, site marked, risks and benefits discussed, surgical consent, monitors and equipment checked, pre-op evaluation, timeout performed and anesthesia consent Lidocaine 1% used for infiltration Left, radial was placed Catheter size: 20 G Hand hygiene performed , maximum sterile barriers used  and Seldinger technique used  Attempts: 1 Procedure performed without using ultrasound guided technique. Following insertion, dressing applied and Biopatch. Post procedure assessment: normal and unchanged  Patient tolerated the procedure well with no immediate complications.

## 2020-01-19 NOTE — Anesthesia Postprocedure Evaluation (Signed)
Anesthesia Post Note  Patient: Jaime [name]  Procedure(s) Performed: BYPASS GRAFT AXILLA-BIFEMORAL USING GORETEX GRAFTS (Right )     Patient location during evaluation: PACU Anesthesia Type: General Level of consciousness: sedated Pain management: pain level controlled Vital Signs Assessment: post-procedure vital signs reviewed and stable Respiratory status: spontaneous breathing and respiratory function stable Cardiovascular status: stable Postop Assessment: no apparent nausea or vomiting Anesthetic complications: no    Last Vitals:  Vitals:   01/19/20 1352 01/19/20 1407  BP: 129/69 115/67  Pulse: 93 92  Resp: 18 (!) 2  Temp:    SpO2: 100% 100%    Last Pain:  Vitals:   01/19/20 1352  TempSrc:   PainSc: Asleep                 Dolph Tavano DANIEL

## 2020-01-19 NOTE — Op Note (Signed)
NAME: Jaime [name]    MRN: [id number] DOB: [birth year]    DATE OF OPERATION: 01/19/2020  PREOP DIAGNOSIS:    Multilevel arterial occlusive disease bilateral lower extremities with rest pain right foot  POSTOP DIAGNOSIS:    Same  PROCEDURE:    1.  Bilateral external iliac artery and common femoral artery endarterectomies 2.  Right axial low bifemoral bypass with 8 mm PTFE graft.   SURGEON: Di Kindle. Edilia Bo, MD  ASSIST: Aggie Moats, PA  ANESTHESIA: General  EBL: 1000 cc  INDICATIONS:    Jaime [name] is a 59 y.o. male who presented with rest pain of the right foot and critical limb ischemia.  He underwent an arteriogram which showed diffuse multilevel disease with severe calcific disease.  His infrarenal aorta had circumferential calcium and I felt that this could potentially result in problems clamping.  In addition and had an abscess in his left groin which had been treated by general surgery and and now healed.  I therefore recommended right axillobifemoral bypass grafting.  His right arm pressure was higher in addition the abscess had been on the left side.  Also the right side was the more symptomatic side.  FINDINGS:   At the completion of the procedure there was a monophasic posterior tibial signal on the right and a monophasic posterior tibial and peroneal signal on the left.  TECHNIQUE:   Cath the patient was taken to the operating room and received a general anesthetic.  The right infraclavicular area right chest abdomen both groins and both lower extremities were prepped and draped in usual sterile fashion.  Attention was first turned to the right groin.  A longitudinal incision was made over the common femoral artery which was markedly calcified.  The artery was occluded here.  I did dissected well up under the inguinal ligament to the external iliac artery where I could place a clamp.  The side branches were controlled with Vesseloops.  The deep femoral artery and  another posterior branch were all controlled with loops as was the superficial femoral artery which was occluded.  Attention was then turned to the left groin.  A longitudinal incision was made over the left common femoral artery.  Again this was markedly calcified.  Again I had to dissected well into the inguinal ligament to find a place on the external iliac artery where I could clamp.  Side branches were controlled in the superficial femoral artery and deep femoral artery was controlled with Vesseloops.  Attention was then turned to exposure of the right axillary artery.  Incision was made below the clavicle and the dissection carried down to the pectoralis major muscle.  The fibers of the muscle were split and the dissection carried down to the clavipectoral fascia.  Cephalic vein was divided allowing exposure of the axillary vein and adjacent axillary artery.  This did have a good pulse.  I was medial to the pectoralis minor muscle.  The subscapular artery was controlled with a loop.  The thyrocervical trunk was divided.  Next a tunnel was created in an inverted U fashion from the right groin incision to the left groin incision and an 8 mm PTFE graft tunneled between the 2 incisions.  I was careful to stay away from the area where he had had the abscess excised which was now healed.  An 8 mm externally supported graft was then tunneled from the right axillary artery to the right groin incision.  The patient was then heparinized.  Attention was first turned to the right groin.  The external iliac artery was clamped and and the superficial femoral artery and deep femoral arteries were controlled with Vesseloops underscore the side branches.  A longitudinal arteriotomy was made in the common femoral artery extending up towards the external iliac artery.  An endarterectomy plane was established and the plaque sharply divided.  This was a coral reef plaque.  Distally the plaque tapered where I could tack it at  the proximal superficial femoral artery.  The deep femoral arteries were patent after endarterectomy was performed.  I divided the plaque proximally.  The femorofemoral graft was spatulated and sewn end-to-side to the common femoral artery extending slightly onto the superficial femoral artery using continuous 6-0 Prolene suture.  Prior to completing this anastomosis the graft was clamped the artery was backbled and flushed appropriately and the anastomosis completed.  The vessels were released and the femorofemoral graft clamped.  Attention was then turned to the right axillary anastomosis.  The axillary artery was clamped proximally and distally and the subscapular artery controlled with a vessel loop.  A longitudinal arteriotomy was made on the axillary artery.  The 8 mm graft was spatulated leaving slight redundancy for arm mobility.  The graft was sewn end-to-side to the axillary artery using continuous 6-0 Prolene suture.  Prior to completing this anastomosis the arteries were backbled and flushed appropriately and the anastomosis completed.  Flow was reestablished to the right arm and then flushed through the graft.  Next the right external iliac artery was again clamped as were the side branches and the superficial femoral artery and deep femoral arteries control.  A longitudinal arteriotomy was made in the hood of the right limb of the femorofemoral graft and after the external rings were removed the graft was spatulated and sewn end to side to the hood of the right limb of the femorofemoral graft using CV 6 PTFE suture.  Prior to completing this anastomosis the arteries were backbled and flushed appropriately and then the femorofemoral graft clamped and flow reestablished to the right leg.  Next attention was turned to the left groin.  This artery was totally occluded.  I was able to control the external iliac artery proximally and controlled the superficial femoral artery and deep femoral arteries.  A  longitudinal arteriotomy was made in the common femoral artery and this was extended through the plaque.  There was no lumen.  I endarterectomized proximally and there was no inflow as there was still proximal occlusion.  Distally there was a good taper and I was able to maintain patency of the deep femoral artery and also in an an additional posterior branch.  The superficial femoral artery was occluded on the left.  I divided the common femoral artery such that I could sew the left limb of the femorofemoral graft into into the common femoral artery extending slightly onto the superficial femoral artery to provide flow to the deep femoral artery and the large posterior branch.  Prior to completing this anastomosis the arteries were backbled and flushed appropriately and the anastomosis completed.  Flow was reestablished to the left leg.  Next the heparin was partially reversed with protamine.  Hemostasis was obtained in the wounds.  The right infraclavicular incision was closed with a deep layer of 3-0 Vicryl, a subcutaneous layer with 3-0 Vicryl and the skin closed with 4-0 Vicryl.  Each of the groin incisions was closed with a deep layer of 2-0 Vicryl, a subcutaneous layer  with 3-0 Vicryl and the skin closed with 4-0 Vicryl.  Dermabond was applied.  At the completion of the procedure there was a monophasic posterior tibial signal on the right and a monophasic posterior tibial and peroneal signal on the left.  Patient was transferred to the recovery room in stable condition.  All needle and sponge counts were correct.  [name], MD, FACS [facility]  DATE OF DICTATION:   01/19/2020

## 2020-01-20 ENCOUNTER — Encounter (HOSPITAL_COMMUNITY): Payer: Self-pay | Admitting: Vascular Surgery

## 2020-01-20 ENCOUNTER — Inpatient Hospital Stay (HOSPITAL_COMMUNITY): Payer: Medicaid Other

## 2020-01-20 DIAGNOSIS — I739 Peripheral vascular disease, unspecified: Secondary | ICD-10-CM

## 2020-01-20 LAB — BPAM RBC
Blood Product Expiration Date: 202105022359
Blood Product Expiration Date: 202105022359
ISSUE DATE / TIME: 202103301435
ISSUE DATE / TIME: 202103301728
Unit Type and Rh: 5100
Unit Type and Rh: 5100

## 2020-01-20 LAB — BASIC METABOLIC PANEL
Anion gap: 7 (ref 5–15)
BUN: <5 mg/dL — ABNORMAL LOW (ref 6–20)
CO2: 20 mmol/L — ABNORMAL LOW (ref 22–32)
Calcium: 7.8 mg/dL — ABNORMAL LOW (ref 8.9–10.3)
Chloride: 97 mmol/L — ABNORMAL LOW (ref 98–111)
Creatinine, Ser: 0.57 mg/dL — ABNORMAL LOW (ref 0.61–1.24)
GFR calc Af Amer: >60 mL/min (ref >60)
GFR calc non Af Amer: >60 mL/min (ref >60)
Glucose, Bld: 98 mg/dL (ref 70–99)
Potassium: 3.7 mmol/L (ref 3.5–5.1)
Sodium: 124 mmol/L — ABNORMAL LOW (ref 135–145)

## 2020-01-20 LAB — TYPE AND SCREEN
ABO/RH(D): O POS
Antibody Screen: NEGATIVE
Unit division: 0
Unit division: 0

## 2020-01-20 LAB — CBC
HCT: 21.1 % — ABNORMAL LOW (ref 39.0–52.0)
Hemoglobin: 7.5 g/dL — ABNORMAL LOW (ref 13.0–17.0)
MCH: 30.5 pg (ref 26.0–34.0)
MCHC: 35.5 g/dL (ref 30.0–36.0)
MCV: 85.8 fL (ref 80.0–100.0)
Platelets: 150 10*3/uL (ref 150–400)
RBC: 2.46 MIL/uL — ABNORMAL LOW (ref 4.22–5.81)
RDW: 17.3 % — ABNORMAL HIGH (ref 11.5–15.5)
WBC: 11.2 10*3/uL — ABNORMAL HIGH (ref 4.0–10.5)
nRBC: 0 % (ref 0.0–0.2)

## 2020-01-20 MED ORDER — ENSURE ENLIVE PO LIQD: 237.0000 mL | Freq: Two times a day (BID) | ORAL | Status: DC

## 2020-01-20 NOTE — Progress Notes (Signed)
Initial Nutrition Assessment  DOCUMENTATION CODES:   Severe malnutrition in context of social or environmental circumstances, Underweight  INTERVENTION:   Liberalize diet to regular    Ensure Enlive po BID, each supplement provides 350 kcal and 20 grams of protein  MVI daily   NUTRITION DIAGNOSIS:   Severe Malnutrition related to social / environmental circumstances as evidenced by severe fat depletion, severe muscle depletion, energy intake < or equal to 50% for > or equal to 1 month.  GOAL:   Patient will meet greater than or equal to 90% of their needs  MONITOR:   PO intake, Supplement acceptance, Weight trends, Labs, I & O's  REASON FOR ASSESSMENT:   Malnutrition Screening Tool    ASSESSMENT:   Patient with PMH significant for BPH, essential HTN, PVD, and depression. Presents this admission with bilateral limb ischemia.   3/30- bilat external iliac and common fem endart, R axillobifem bypass  Pt reports decreased intake over the last three months due to depression. States during this time he consumed one meal daily that consisted of potato chips or other snack food item. Does not use supplementation at home. Appetite slowly progressing this admission. Discussed the importance of protein intake for preservation of lean body mass. Pt amendable to Ensure.   Pt endorses a UBW of 170 lb and a 40 lb unintentional wt loss in three months. Records indicate pt weighed 137 lb on 08/14/2019 and 125 lb this admission (8.6% wt loss in 5 months, insignificant for time frame).   I/O: +2,447 ml since admit  UOP: 2,750 ml x 24 hrs   Medications: colace Labs: Na 124 (L)   NUTRITION - FOCUSED PHYSICAL EXAM:    Most Recent Value  Orbital Region  Severe depletion  Upper Arm Region  Severe depletion  Thoracic and Lumbar Region  Severe depletion  Buccal Region  Severe depletion  Temple Region  Severe depletion  Clavicle Bone Region  Severe depletion  Clavicle and Acromion Bone  Region  Severe depletion  Scapular Bone Region  Severe depletion  Dorsal Hand  Severe depletion  Patellar Region  Severe depletion  Anterior Thigh Region  Severe depletion  Posterior Calf Region  Severe depletion  Edema (RD Assessment)  None  Hair  Reviewed  Eyes  Reviewed  Mouth  Reviewed  Skin  Reviewed  Nails  Reviewed     Diet Order:   Diet Order            Diet Heart Room service appropriate? Yes with Assist; Fluid consistency: Thin  Diet effective now              EDUCATION NEEDS:   Education needs have been addressed  Skin:  Skin Assessment: Skin Integrity Issues: Skin Integrity Issues:: Incisions Incisions: L/R groin, chest  Last BM:  3/29  Height:   Ht Readings from Last 1 Encounters:  01/19/20 5\' 10"  (1.778 m)    Weight:   Wt Readings from Last 1 Encounters:  01/19/20 56.7 kg    BMI:  Body mass index is 17.94 kg/m.  Estimated Nutritional Needs:   Kcal:  1750-1950 kcal  Protein:  90-105 grams  Fluid:  >/= 1.7 L/day   01/21/20 RD, LDN Clinical Nutrition Pager listed in [facility]

## 2020-01-20 NOTE — Progress Notes (Addendum)
   VASCULAR SURGERY ASSESSMENT & PLAN:   POD1 BILATERAL FEMORAL ENDARTERECTOMIES AND RIGHT AXILLOBIFEMORAL BYPASS: Good Doppler signals both feet.  Both feet are warm.  The signals are stronger on the left.  He has more severe infrainguinal arterial occlusive disease on the right.  He has bilateral SFA occlusions. 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  DVT PROPHYLAXIS: He is on subcu heparin.  ACUTE BLOOD LOSS ANEMIA: This hemoglobin is improved this morning after transfusion yesterday.  The loss significant blood from the needle holes from the Gore-Tex graft.  DISPOSITION: Anticipate discharge Friday.   SUBJECTIVE:   No specific complaints.  PHYSICAL EXAM:   Vitals:   01/19/20 1800 01/19/20 2033 01/19/20 2338 01/20/20 0350  BP: 104/62 123/66 114/67 135/67  Pulse: 99 95 98 96  Resp: 17 15 12 10   Temp: 98.1 F (36.7 C) 97.9 F (36.6 C) 98.2 F (36.8 C) 98.3 F (36.8 C)  TempSrc: Oral Oral Oral Oral  SpO2: 100% 100% 100% 100%  Weight:      Height:       Good Doppler signals in the dorsalis pedis and posterior tibial positions bilaterally. His incisions all look fine.  LABS:   Lab Results  Component Value Date   WBC 11.2 (H) 01/20/2020   HGB 7.5 (L) 01/20/2020   HCT 21.1 (L) 01/20/2020   MCV 85.8 01/20/2020   PLT 150 01/20/2020   Lab Results  Component Value Date   CREATININE 0.57 (L) 01/20/2020   Lab Results  Component Value Date   INR 0.9 01/14/2020    PROBLEM LIST:    Active Problems:   PAD (peripheral artery disease) (HCC)   CURRENT MEDS:   . aspirin EC  81 mg Oral Daily  . atorvastatin  10 mg Oral Daily  . Chlorhexidine Gluconate Cloth  6 each Topical Daily  . docusate sodium  100 mg Oral Daily  . heparin  5,000 Units Subcutaneous Q8H  . [START ON 01/21/2020] lisinopril  5 mg Oral Daily  . pantoprazole  40 mg Oral Daily    03/22/2020 Office: [phone] 01/20/2020

## 2020-01-20 NOTE — Progress Notes (Signed)
ABI has been completed.   Preliminary results in CV Proc.   Blanch Media 01/20/2020 2:01 PM

## 2020-01-20 NOTE — Evaluation (Signed)
Physical Therapy Evaluation Patient Details Name: Jaime [name] MRN: [id number] DOB: [birth year] Today's Date: 01/20/2020   History of Present Illness  pt is a 59 yo male s/p Bil external iliac artery and common femoral artery endarterectomies, right axial low bifemoral bypass with graft on 3/30PMH includes PVD, HTN, BPH, depression, colon polyps  Clinical Impression  Pt is a 59 yo male admitted for above. Pt received resting in bed and agreeable to PT. 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 Pt previously independent with all ADLs/IADLs. Pt reporting 7/10 pain with pain primarily in groin due to incision. Pt mod I to get EOB with mildly increased time and effort secondary to pain. Pt min guard for transfers and ambulation without AD. Pt ambulated with slow, guarded gait with dec stride length secondary to increased pain. Overall steady with no overt unsteadiness or LOB. Pt able to maintain static standing balance at sink without UE support to brush teeth and wash hands. Pt able to demo stepping over tub for bathing with single UE support. Pt would benefit from acute PT for further activity progression and dec pain to maximize return to PLOF and do not see any further PT follow up being necessary.      Follow Up Recommendations No PT follow up    Equipment Recommendations  None recommended by PT    Recommendations for Other Services       Precautions / Restrictions Precautions Precautions: Fall Restrictions Weight Bearing Restrictions: No      Mobility  Bed Mobility Overal bed mobility: Modified Independent             General bed mobility comments: no physical assist required to get EOB, mildly increased time and effort secondary to pain in Bil LE  Transfers Overall transfer level: Needs assistance Equipment used: None Transfers: Sit to/from Stand Sit to Stand: Min guard         General transfer comment: min guard for safety  overall steady  Ambulation/Gait Ambulation/Gait assistance: Min guard Gait Distance (Feet): 125 Feet Assistive device: None Gait Pattern/deviations: Step-through pattern;Decreased stride length;Antalgic Gait velocity: dec   General Gait Details: pt ambulated without AD, no overt LOB or unsteadines noted, pt antalgic, guarded and slow gait speed secondary to Bil LE pain esp at groin incision  Stairs            Wheelchair Mobility    Modified Rankin (Stroke Patients Only)       Balance Overall balance assessment: Mild deficits observed, not formally tested                                           Pertinent Vitals/Pain Pain Assessment: 0-10 Pain Score: 7  Pain Location: R foot 7/10; groin area 5/10 Pain Descriptors / Indicators: Operative site guarding;Burning;Sore;Grimacing Pain Intervention(s): Limited activity within patient's tolerance;Monitored during session    Home Living Family/patient expects to be discharged to:: Private residence Living Arrangements: Non-relatives/Friends(roommate, who is limited in amount of assist) Available Help at Discharge: Friend(s);Available 24 hours/day Type of Home: Apartment Home Access: Stairs to enter   Entrance Stairs-Number of Steps: 1 Home Layout: One level Home Equipment: Cane - single point;Other (comment)      Prior Function Level of Independence: Independent         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  Hand Dominance   Dominant Hand: Right    Extremity/Trunk Assessment   Upper Extremity Assessment Upper Extremity Assessment: Overall WFL for tasks assessed;Defer to OT evaluation    Lower Extremity Assessment Lower Extremity Assessment: Generalized weakness;Overall WFL for tasks assessed(difficult to assess due to pain)    Cervical / Trunk Assessment Cervical / Trunk Assessment: Normal  Communication   Communication: No difficulties  Cognition Arousal/Alertness:  Awake/alert Behavior During Therapy: WFL for tasks assessed/performed Overall Cognitive Status: Within Functional Limits for tasks assessed                                        General Comments General comments (skin integrity, edema, etc.): VSS, HR up to 120 once during sharp pain while standing at sink    Exercises     Assessment/Plan    PT Assessment Patient needs continued PT services  PT Problem List Decreased strength;Decreased mobility;Decreased range of motion;Decreased activity tolerance;Decreased balance;Pain;Decreased knowledge of use of DME       PT Treatment Interventions DME instruction;Therapeutic exercise;Gait training;Balance training;Stair training;Neuromuscular re-education;Functional mobility training;Therapeutic activities;Patient/family education    PT Goals (Current goals can be found in the Care Plan section)  Acute Rehab PT Goals Patient Stated Goal: to go home when pain is decreased PT Goal Formulation: With patient Time For Goal Achievement: 02/03/20 Potential to Achieve Goals: Good    Frequency Min 3X/week   Barriers to discharge Decreased caregiver support      Co-evaluation PT/OT/SLP Co-Evaluation/Treatment: Yes Reason for Co-Treatment: For patient/therapist safety;To address functional/ADL transfers PT goals addressed during session: Mobility/safety with mobility;Balance OT goals addressed during session: ADL's and self-care       AM-PAC PT "6 Clicks" Mobility  Outcome Measure Help needed turning from your back to your side while in a flat bed without using bedrails?: None Help needed moving from lying on your back to sitting on the side of a flat bed without using bedrails?: None Help needed moving to and from a bed to a chair (including a wheelchair)?: A Little Help needed standing up from a chair using your arms (e.g., wheelchair or bedside chair)?: A Little Help needed to walk in hospital room?: A Little Help needed  climbing 3-5 steps with a railing? : A Little 6 Click Score: 20    End of Session Equipment Utilized During Treatment: Gait belt Activity Tolerance: Patient tolerated treatment well Patient left: in chair;with call bell/phone within reach Nurse Communication: Mobility status PT Visit Diagnosis: Other abnormalities of gait and mobility (R26.89);Pain Pain - Right/Left: (bil) Pain - part of body: Leg(lower legs and groins at incisions)    Time: 5329-9242 PT Time Calculation (min) (ACUTE ONLY): 19 min   Charges:   PT Evaluation $PT Eval Moderate Complexity: 1 Mod          Tyneisha Hegeman PT, DPT 3:46 PM,01/20/20   Raghad Lorenz Shyrl Numbers 01/20/2020, 3:42 PM

## 2020-01-20 NOTE — Evaluation (Signed)
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 Pt currently performing ADL with supervisionA overall with increased time and pain in BLEs. Pt ambulating in room with no AD and supervisionA. No LOB episodes during session. Pt education for energy conservation techniques for ADL and  Body mechanics. Pt appears close to functional baseline with soreness in BLEs to decrease with time. OT signing off at this time.    Follow Up Recommendations  No OT follow up;Supervision - Intermittent    Equipment Recommendations  None recommended by OT    Recommendations for Other Services       Precautions / Restrictions Precautions Precautions: Fall Restrictions Weight Bearing Restrictions: No      Mobility Bed Mobility Overal bed mobility: Modified Independent             General bed mobility comments: no physical assist required to get EOB, mildly increased time and effort secondary to pain in Bil LE  Transfers Overall transfer level: Needs assistance Equipment used: None Transfers: Sit to/from Stand Sit to Stand: Min guard         General transfer comment: min guard for safety overall steady    Balance Overall balance assessment: Mild deficits observed, not formally tested                                         ADL either performed or assessed with clinical judgement   ADL Overall ADL's : Needs assistance/impaired Eating/Feeding: Modified independent;Sitting   Grooming:  Supervision/safety;Standing   Upper Body Bathing: Supervision/ safety;Standing   Lower Body Bathing: Supervison/ safety;Sitting/lateral leans;Sit to/from stand   Upper Body Dressing : Supervision/safety;Standing   Lower Body Dressing: Supervision/safety;Sitting/lateral leans;Sit to/from stand;Cueing for safety   Toilet Transfer: Supervision/safety   Toileting- Clothing Manipulation and Hygiene: Supervision/safety;Sitting/lateral lean       Functional mobility during ADLs: Min guard General ADL Comments: Pt using hip hike technique and increased time for LB dressing. Pt donning socks with no physical assist and standing at sink for grooming task with supervisionA. Pain in BLEs, mostly in groin area.      Vision Baseline Vision/History: No visual deficits Patient Visual Report: No change from baseline Vision Assessment?: No apparent visual deficits     Perception     Praxis      Pertinent Vitals/Pain Pain Assessment: 0-10 Pain Score: 7  Pain Location: R foot 7/10; groin area 5/10 Pain Descriptors / Indicators: Operative site guarding;Burning;Sore;Grimacing Pain Intervention(s): Limited activity within patient's tolerance;Monitored during session     Hand Dominance Right   Extremity/Trunk Assessment Upper Extremity Assessment Upper Extremity Assessment: Overall WFL for tasks assessed;Defer to OT evaluation   Lower Extremity Assessment Lower Extremity Assessment: Generalized weakness;Overall WFL for tasks assessed(difficult to assess due to pain)   Cervical / Trunk Assessment Cervical / Trunk Assessment: Normal   Communication Communication Communication: No difficulties   Cognition Arousal/Alertness: Awake/alert Behavior During Therapy: WFL for tasks assessed/performed Overall Cognitive Status: Within Functional  Limits for tasks assessed                                     General Comments  VSS, HR up to 120 once during sharp pain while standing  at sink    Exercises     Shoulder Instructions      Home Living Family/patient expects to be discharged to:: Private residence Living Arrangements: Non-relatives/Friends(roommate, who is limited in amount of assist) Available Help at Discharge: Friend(s);Available 24 hours/day Type of Home: Apartment Home Access: Stairs to enter Entrance Stairs-Number of Steps: 1   Home Layout: One level     Bathroom Shower/Tub: Chief Strategy Officer: Standard     Home Equipment: Cane - single point;Other (comment)          Prior Functioning/Environment Level of Independence: Independent        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        OT Problem List: Decreased activity tolerance      OT Treatment/Interventions:      OT Goals(Current goals can be found in the care plan section) Acute Rehab OT Goals Patient Stated Goal: to go home when pain is decreased  OT Frequency:     Barriers to D/C:            Co-evaluation   Reason for Co-Treatment: For patient/therapist safety;To address functional/ADL transfers PT goals addressed during session: Mobility/safety with mobility;Balance OT goals addressed during session: ADL's and self-care      AM-PAC OT "6 Clicks" Daily Activity     Outcome Measure Help from another person eating meals?: None Help from another person taking care of personal grooming?: None Help from another person toileting, which includes using toliet, bedpan, or urinal?: None Help from another person bathing (including washing, rinsing, drying)?: A Little Help from another person to put on and taking off regular upper body clothing?: None Help from another person to put on and taking off regular lower body clothing?: None 6 Click Score: 23   End of Session Equipment Utilized During Treatment: Gait belt Nurse Communication: Mobility status  Activity Tolerance: Patient tolerated treatment well Patient left: in chair;with call bell/phone within  reach  OT Visit Diagnosis: Muscle weakness (generalized) (M62.81);Pain Pain - part of body: Leg(b/l)                Time: 2751-7001 OT Time Calculation (min): 21 min Charges:  OT General Charges $OT Visit: 1 Visit OT Evaluation $OT Eval Moderate Complexity: 1 Mod  Flora Lipps, OTR/L Acute Rehabilitation Services Pager: [phone] Office: [phone]   Kyndal Gloster C 01/20/2020, 3:43 PM

## 2020-01-21 DIAGNOSIS — E43 Unspecified severe protein-calorie malnutrition: Secondary | ICD-10-CM | POA: Insufficient documentation

## 2020-01-21 MED ORDER — OXYCODONE-ACETAMINOPHEN 5-325 MG PO TABS: 1.0000 | ORAL_TABLET | Freq: Four times a day (QID) | ORAL | 0 refills | Status: DC | PRN

## 2020-01-21 MED FILL — OXYCODONE-APAP 5-325MG: 5-325 | 5 days supply | Qty: 30 | Fill #0
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
## 2020-01-21 NOTE — Discharge Instructions (Signed)
 [facility] [place]  Discharge instructions  Lower Extremity Bypass Surgery  Please refer to the following instruction for your post-procedure care. Your surgeon or physician assistant will discuss any changes with you.  Activity  You are encouraged to walk as much as you can. You can slowly return to normal activities during the month after your surgery. Avoid strenuous activity and heavy lifting until your doctor tells you it's OK. Avoid activities such as vacuuming or swinging a golf club. Do not drive until your doctor give the OK and you are no longer taking prescription pain medications. It is also normal to have difficulty with sleep habits, eating and bowel movement after surgery. These will go away with time.  Bathing/Showering  Shower daily after you go home. Do not soak in a bathtub, hot tub, or swim until the incision heals completely.  Incision Care  Clean your incision with mild soap and water. Shower every day. Pat the area dry with a clean towel. You do not need a bandage unless otherwise instructed. Do not apply any ointments or creams to your incision. If you have open wounds you will be instructed how to care for them or a visiting nurse may be arranged for you. If you have staples or sutures along your incision they will be removed at your post-op appointment. You may have skin glue on your incision. Do not peel it off. It will come off on its own in about one week.  Wash the groin wound with soap and water daily and pat dry. (No tub bath-only shower)  Then put a dry gauze or washcloth in the groin to keep this area dry to help prevent wound infection.  Do this daily and as needed.  Do not use Vaseline or neosporin on your incisions.  Only use soap and water on your incisions and then protect and keep dry.  Diet  Resume your normal diet. There are no special food restrictions following this procedure. A low fat/ low cholesterol diet is  recommended for all patients with vascular disease. In order to heal from your surgery, it is CRITICAL to get adequate nutrition. Your body requires vitamins, minerals, and protein. Vegetables are the best source of vitamins and minerals. Vegetables also provide the perfect balance of protein. Processed food has little nutritional value, so try to avoid this.  Medications  Resume taking all your medications unless your doctor or physician assistant tells you not to. If your incision is causing pain, you may take over-the-counter pain relievers such as acetaminophen (Tylenol). If you were prescribed a stronger pain medication, please aware these medication can cause nausea and constipation. Prevent nausea by taking the medication with a snack or meal. Avoid constipation by drinking plenty of fluids and eating foods with high amount of fiber, such as fruits, vegetables, and grains. Take Colace 100 mg (an over-the-counter stool softener) twice a day as needed for constipation.  Do not take Tylenol if you are taking prescription pain medications.  Follow Up  Our office will schedule a follow up appointment 2-3 weeks following discharge.  Please call us immediately for any of the following conditions  Severe or worsening pain in your legs or feet while at rest or while walking Increase pain, redness, warmth, or drainage (pus) from your incision site(s) Fever of 101 degree or higher The swelling in your leg with the bypass suddenly worsens and becomes more painful than when you were in the hospital If you have   been instructed to feel your graft pulse then you should do so every day. If you can no longer feel this pulse, call the office immediately. Not all patients are given this instruction.  Leg swelling is common after leg bypass surgery.  The swelling should improve over a few months following surgery. To improve the swelling, you may elevate your legs above the level of your heart while you are  sitting or resting. Your surgeon or physician assistant may ask you to apply an ACE wrap or wear compression (TED) stockings to help to reduce swelling.  Reduce your risk of vascular disease  Stop smoking. If you would like help call [facility] at 1-800-QUIT-NOW (1-800-784-8669) or [place] at 336-586-4000.  Manage your cholesterol Maintain a desired weight Control your diabetes weight Control your diabetes Keep your blood pressure down  If you have any questions, please call the office at [phone]  

## 2020-01-21 NOTE — Progress Notes (Addendum)
  Progress Note  VASCULAR SURGERY ASSESSMENT & PLAN:   POD 2 BILATERAL FEMORAL ENDARTERECTOMIES AND RIGHT AXILLOBIFEMORAL BYPASS: Good Doppler signals both feet.  Both feet are warm.  The signals are stronger on the left.  He has more severe infrainguinal arterial occlusive disease on the right.  He has bilateral SFA occlusions.  Her ABIs improved appropriately postop.  VASCULAR QUALITY INITIATIVE: He is on aspirin and is on a statin.  DISPOSITION: For discharge today.  Waverly Ferrari, MD Office: [phone]    01/21/2020 7:45 AM 2 Days Post-Op  Subjective:  Ready to go home.  Afebrile HR 80's-100's NSR 120's systolic 100% RA  Vitals:   01/20/20 2152 01/20/20 2338  BP: 120/67 (!) 120/54  Pulse: 90 89  Resp: 15 13  Temp: 97.9 F (36.6 C) 97.9 F (36.6 C)  SpO2: 100% 100%    Physical Exam: Cardiac:  regular Lungs:  Non labored Incisions:  All incisions look good Extremities:  Brisk doppler signals bilateral PT/DP   CBC    Component Value Date/Time   WBC 11.2 (H) 01/20/2020 0430   RBC 2.46 (L) 01/20/2020 0430   HGB 7.5 (L) 01/20/2020 0430   HGB 14.8 08/05/2019 1136   HCT 21.1 (L) 01/20/2020 0430   HCT 44.1 08/05/2019 1136   PLT 150 01/20/2020 0430   PLT 251 08/05/2019 1136   MCV 85.8 01/20/2020 0430   MCV 96 08/05/2019 1136   MCH 30.5 01/20/2020 0430   MCHC 35.5 01/20/2020 0430   RDW 17.3 (H) 01/20/2020 0430   RDW 11.9 08/05/2019 1136   LYMPHSABS 1.2 01/19/2020 2240   LYMPHSABS 2.4 08/05/2019 1136   MONOABS 0.7 01/19/2020 2240   EOSABS 0.0 01/19/2020 2240   EOSABS 0.1 08/05/2019 1136   BASOSABS 0.0 01/19/2020 2240   BASOSABS 0.0 08/05/2019 1136    BMET    Component Value Date/Time   NA 124 (L) 01/20/2020 0430   NA 139 08/05/2019 1136   K 3.7 01/20/2020 0430   CL 97 (L) 01/20/2020 0430   CO2 20 (L) 01/20/2020 0430   GLUCOSE 98 01/20/2020 0430   BUN <5 (L) 01/20/2020 0430   BUN 3 (L) 08/05/2019 1136   CREATININE 0.57 (L) 01/20/2020 0430   CALCIUM 7.8 (L) 01/20/2020 0430   GFRNONAA >60 01/20/2020 0430   GFRAA >60 01/20/2020 0430    INR    Component Value Date/Time   INR 0.9 01/14/2020 0837     Intake/Output Summary (Last 24 hours) at 01/21/2020 0745 Last data filed at 01/20/2020 1900 Gross per 24 hour  Intake 1100 ml  Output 475 ml  Net 625 ml     Assessment:  59 y.o. male is s/p:  1.  Bilateral external iliac artery and common femoral artery endarterectomies 2.  Right axial low bifemoral bypass with 8 mm PTFE graft.   2 Days Post-Op  Plan: -pt doing well with brisk doppler signals bilaterally -discharge home today and f/u with Dr. Edilia Bo in 2-3 weeks. -discussed groin wound care with pt. -continue to elevate scrotum for scrotal edema    Doreatha Massed, PA-C [facility] [phone] 01/21/2020 7:45 AM

## 2020-02-03 ENCOUNTER — Other Ambulatory Visit: Payer: Self-pay

## 2020-02-03 ENCOUNTER — Ambulatory Visit (INDEPENDENT_AMBULATORY_CARE_PROVIDER_SITE_OTHER): Payer: Self-pay | Admitting: Vascular Surgery

## 2020-02-03 ENCOUNTER — Encounter: Payer: Self-pay | Admitting: Vascular Surgery

## 2020-02-03 VITALS — BP 128/63 | HR 104 | Temp 97.3°F | Ht 70.0 in | Wt 125.0 lb

## 2020-02-03 DIAGNOSIS — I70229 Atherosclerosis of native arteries of extremities with rest pain, unspecified extremity: Secondary | ICD-10-CM

## 2020-02-03 NOTE — Progress Notes (Signed)
   Patient name: Jaime [name] MRN: [id number] DOB: 15-May-1961 Sex: male  REASON FOR VISIT:   Follow-up after right axillobifemoral bypass grafting  HPI:   Jaime [name] is a pleasant 59 y.o. male who had presented with multilevel arterial occlusive disease and rest pain.  He had severe calcific disease I was concerned that his infrarenal aorta had circumferential calcium.  In addition he had an abscess in his left groin which had been treated by general surgery.  I recommended right axillobifemoral bypass grafting.  He does have infrainguinal arterial occlusive disease bilaterally.  Since his surgery states that his legs feel better.  His left leg feels better than the right.  He has significant infrainguinal arterial occlusive disease on the right.  Current Outpatient Medications  Medication Sig Dispense Refill  . acetaminophen (TYLENOL) 500 MG tablet Take 500 mg by mouth every 6 (six) hours as needed for mild pain.    Marland Kitchen aspirin EC 81 MG tablet Take 1 tablet (81 mg total) by mouth daily.    Marland Kitchen atorvastatin (LIPITOR) 10 MG tablet Take 1 tablet (10 mg total) by mouth daily. 30 tablet 11  . Blood Pressure Monitoring (BLOOD PRESSURE KIT) DEVI 1 Device by Does not apply route daily. 1 each 0  . buPROPion (WELLBUTRIN XL) 150 MG 24 hr tablet 1 tab by mouth every morning 30 tablet 11  . lisinopril (ZESTRIL) 5 MG tablet Take 1 tablet by mouth once daily (Patient taking differently: Take 5 mg by mouth daily. ) 30 tablet 11  . omeprazole (PRILOSEC) 20 MG capsule 1 tab by mouth on empty stomach at bedtime. 30 capsule 3   No current facility-administered medications for this visit.    REVIEW OF SYSTEMS:  '[X]'$  denotes positive finding, '[ ]'$  denotes negative finding Vascular    Leg swelling    Cardiac    Chest pain or chest pressure:    Shortness of breath upon exertion:    Short of breath when lying flat:    Irregular heart rhythm:    Constitutional    Fever or chills:     PHYSICAL EXAM:   Vitals:     02/03/20 1522  BP: 128/63  Pulse: (!) 104  Temp: (!) 97.3 F (36.3 C)  TempSrc: Temporal  SpO2: 99%  Weight: 125 lb (56.7 kg)  Height: '5\' 10"'$  (1.778 m)    GENERAL: The patient is a well-nourished male, in no acute distress. The vital signs are documented above. CARDIOVASCULAR: There is a regular rate and rhythm. PULMONARY: There is good air exchange bilaterally without wheezing or rales. VASCULAR: He has a palpable right radial pulse and palpable femoral pulses.  He has good Doppler signals in both feet.  This Doppler signals on the left are stronger.  DATA:   No new data.  MEDICAL ISSUES:   STATUS POST RIGHT AXILLOBIFEMORAL BYPASS: His bypass graft is patent.  His symptoms have improved.  I have encouraged him to stay as active as possible.  He has been smoking some and I encouraged him to try to get off the cigarettes completely.  I have ordered follow-up ABIs and a graft duplex in 3 months and I will see him back at that time.  He knows to call sooner if he has problems.  [name] [facility] [phone]

## 2020-02-04 ENCOUNTER — Other Ambulatory Visit: Payer: Self-pay | Admitting: *Deleted

## 2020-02-04 DIAGNOSIS — I739 Peripheral vascular disease, unspecified: Secondary | ICD-10-CM

## 2020-02-04 DIAGNOSIS — I70229 Atherosclerosis of native arteries of extremities with rest pain, unspecified extremity: Secondary | ICD-10-CM

## 2020-02-17 ENCOUNTER — Other Ambulatory Visit: Payer: Self-pay | Admitting: *Deleted

## 2020-02-17 ENCOUNTER — Telehealth: Payer: Self-pay

## 2020-02-17 DIAGNOSIS — I739 Peripheral vascular disease, unspecified: Secondary | ICD-10-CM

## 2020-02-17 NOTE — Telephone Encounter (Signed)
Pt is having increased R leg pain per Sharice at [employer], to the point that he is unable to drive. Per Dr. Edilia Bo, pt will return to office to have ABIs and office visit with him next week. I have been unable to reach pt to give him appt date/time, however, Lanier Prude has been in contact with him and provided him this info.

## 2020-02-23 ENCOUNTER — Telehealth (HOSPITAL_COMMUNITY): Payer: Self-pay

## 2020-02-23 NOTE — Telephone Encounter (Signed)

## 2020-02-24 ENCOUNTER — Other Ambulatory Visit: Payer: Self-pay

## 2020-02-24 ENCOUNTER — Encounter: Payer: Self-pay | Admitting: Vascular Surgery

## 2020-02-24 ENCOUNTER — Ambulatory Visit (INDEPENDENT_AMBULATORY_CARE_PROVIDER_SITE_OTHER): Payer: Self-pay | Admitting: Vascular Surgery

## 2020-02-24 ENCOUNTER — Ambulatory Visit (HOSPITAL_COMMUNITY)
Admission: RE | Admit: 2020-02-24 | Discharge: 2020-02-24 | Disposition: A | Discharge status: Home / Self Care | Payer: Medicaid Other | Source: Ambulatory Visit | Attending: Vascular Surgery | Admitting: Vascular Surgery

## 2020-02-24 VITALS — BP 138/78 | HR 100 | Temp 98.2°F | Resp 20 | Ht 70.0 in | Wt 122.0 lb

## 2020-02-24 DIAGNOSIS — I739 Peripheral vascular disease, unspecified: Secondary | ICD-10-CM

## 2020-02-24 DIAGNOSIS — Z48812 Encounter for surgical aftercare following surgery on the circulatory system: Secondary | ICD-10-CM

## 2020-02-24 DIAGNOSIS — I70229 Atherosclerosis of native arteries of extremities with rest pain, unspecified extremity: Secondary | ICD-10-CM

## 2020-02-24 NOTE — Progress Notes (Signed)
Patient name: Jaime [name] MRN: [id number] DOB: 03-15-1961 Sex: male  REASON FOR VISIT:   Rest pain of the right foot  HPI:   Jaime [name] is a pleasant 59 y.o. male who presented with multilevel arterial occlusive disease and rest pain of the right foot.  On 01/19/2020 he underwent bilateral external iliac artery and common femoral artery endarterectomies with a right axillofemoral bifemoral bypass with 8 mm PTFE graft.  His preoperative ABI on the right was 27%.  Postop day #1 was 41%.  Today his ABI is 48%.  His preoperative ABI on the left was 28%.  Postoperative day #1 it was 60%.  Today it is 66%.  He complains of some pain in the right foot and numbness which she has had.  He states that his leg overall feels better.  He has been walking some.  He continues to smoke some.  Current Outpatient Medications  Medication Sig Dispense Refill  . aspirin EC 81 MG tablet Take 1 tablet (81 mg total) by mouth daily.    Marland Kitchen atorvastatin (LIPITOR) 10 MG tablet Take 1 tablet (10 mg total) by mouth daily. 30 tablet 11  . lisinopril (ZESTRIL) 5 MG tablet Take 1 tablet by mouth once daily (Patient taking differently: Take 5 mg by mouth daily. ) 30 tablet 11  . acetaminophen (TYLENOL) 500 MG tablet Take 500 mg by mouth every 6 (six) hours as needed for mild pain.    . Blood Pressure Monitoring (BLOOD PRESSURE KIT) DEVI 1 Device by Does not apply route daily. 1 each 0  . buPROPion (WELLBUTRIN XL) 150 MG 24 hr tablet 1 tab by mouth every morning (Patient not taking: Reported on 02/24/2020) 30 tablet 11  . omeprazole (PRILOSEC) 20 MG capsule 1 tab by mouth on empty stomach at bedtime. (Patient not taking: Reported on 02/24/2020) 30 capsule 3   No current facility-administered medications for this visit.    REVIEW OF SYSTEMS:  [X]  denotes positive finding, [ ]  denotes negative finding Vascular    Leg swelling    Cardiac    Chest pain or chest pressure:    Shortness of breath upon exertion:    Short of  breath when lying flat:    Irregular heart rhythm:    Constitutional    Fever or chills:     PHYSICAL EXAM:   Vitals:   02/24/20 0834  BP: 138/78  Pulse: 100  Resp: 20  Temp: 98.2 F (36.8 C)  SpO2: 97%  Weight: 122 lb (55.3 kg)  Height: 5' 10"  (1.778 m)    GENERAL: The patient is a well-nourished male, in no acute distress. The vital signs are documented above. CARDIOVASCULAR: There is a regular rate and rhythm. PULMONARY: There is good air exchange bilaterally without wheezing or rales. VASCULAR: He has strong femoral pulses bilaterally. Both feet are warm with monophasic Doppler signals. His incisions are healing nicely.  DATA:   ARTERIAL DOPPLER STUDY: I have independently interpreted his arterial Doppler study today.  On the right side, which is the side of concern he has a monophasic posterior tibial and dorsalis pedis signal.  ABI is 48%.  Toe pressure is 21 mmHg.  On the left side he has a monophasic dorsalis pedis and posterior tibial signal.  ABI is 66%.  Toe pressure is 72 mmHg.  I reviewed his previous arteriogram which showed occlusion of the popliteal artery and severe diffuse superficial femoral artery occlusive disease.  There is reconstitution of a small below-knee popliteal  artery which feeds the anterior tibial and posterior tibial arteries.  MEDICAL ISSUES:   STATUS POST RIGHT AXILLOBIFEMORAL BYPASS: His bypass graft is patent and his ABIs have improved.  He does have significant infrainguinal arterial occlusive disease on the right and if his symptoms worsen I think he would be a candidate for a right femoral to below-knee popliteal artery bypass.  It does not look like he has had a vein map.  We have had a long discussion about the importance of tobacco cessation.  I explained that even 2 cigarettes a day causes significant vasoconstriction of the collaterals which compromised flow.  I have encouraged him to stay as active as possible to encourage  development of collaterals.  We have also discussed the importance of nutrition.  [name] [facility] [phone]

## 2020-02-26 ENCOUNTER — Other Ambulatory Visit: Payer: Self-pay | Admitting: *Deleted

## 2020-02-26 DIAGNOSIS — I739 Peripheral vascular disease, unspecified: Secondary | ICD-10-CM

## 2020-03-15 ENCOUNTER — Other Ambulatory Visit: Payer: Self-pay

## 2020-03-15 ENCOUNTER — Encounter: Payer: Self-pay | Admitting: Internal Medicine

## 2020-03-15 ENCOUNTER — Ambulatory Visit (INDEPENDENT_AMBULATORY_CARE_PROVIDER_SITE_OTHER): Payer: Medicaid Other | Admitting: Internal Medicine

## 2020-03-15 VITALS — BP 130/60 | HR 78 | Resp 12 | Ht 70.0 in | Wt 121.0 lb

## 2020-03-15 DIAGNOSIS — B351 Tinea unguium: Secondary | ICD-10-CM | POA: Diagnosis not present

## 2020-03-15 DIAGNOSIS — B353 Tinea pedis: Secondary | ICD-10-CM

## 2020-03-15 DIAGNOSIS — R634 Abnormal weight loss: Secondary | ICD-10-CM

## 2020-03-15 DIAGNOSIS — I10 Essential (primary) hypertension: Secondary | ICD-10-CM

## 2020-03-15 DIAGNOSIS — Z716 Tobacco abuse counseling: Secondary | ICD-10-CM

## 2020-03-15 DIAGNOSIS — Z720 Tobacco use: Secondary | ICD-10-CM

## 2020-03-15 DIAGNOSIS — I739 Peripheral vascular disease, unspecified: Secondary | ICD-10-CM | POA: Diagnosis not present

## 2020-03-15 DIAGNOSIS — D500 Iron deficiency anemia secondary to blood loss (chronic): Secondary | ICD-10-CM

## 2020-03-15 DIAGNOSIS — R7401 Elevation of levels of liver transaminase levels: Secondary | ICD-10-CM

## 2020-03-15 MED ORDER — TERBINAFINE HCL 250 MG PO TABS: 250.0000 mg | ORAL_TABLET | Freq: Every day | ORAL | 0 refills | Status: AC

## 2020-03-15 MED ORDER — TEA TREE 100 % EX OIL: TOPICAL_OIL | CUTANEOUS | Status: AC

## 2020-03-15 NOTE — Patient Instructions (Signed)
For foot and toenail fungus:  Spray your shoes with Lysol or Lotrimin Antifungal spray when you start the medication for your feet.   Re-spray your the shoes you wear with each wearing and allow to dry before wearing again You will need to continue to spray the shoes you have during the infection of your toenails and feet have been thrown out due to wear.  Once your toenails and feet are clear of infection, the shoes you buy new do not necessarily need to be sprayed. Clean your shower floor once to twice daily with bleach containing cleaner.  Use Nicotine gum when have urge to smoke--No cigarettes

## 2020-03-15 NOTE — Progress Notes (Signed)
Subjective:    Patient ID: Jaime [name], male   DOB: 1961/03/06, 59 y.o.   MRN: [id number]   HPI   1.  PAD:  Pain in legs is subsiding following Right axillobifemoral bypass and bilateral external iliac and common femoral artery endarterectomies on 01/19/20 with Dr. Edilia Bo.   He does continue to have some pain in his right heel down.  Toes are numb and plantar foot painful.  When he tries to sleep and stretch out is when he has more pain in the right foot.  When he sits with feet dependent, the right foot discomfort subsides a bit.    He is out and walking more.    He was seen by Dr. Edilia Bo on 02/24/20 with findings of improved blood flow to LE bilaterally, but does have occlusive disease in the infrainguinal arteries on the right.  If symptoms worsen, they have discussed a possible right fem below knee popliteal bypass.  Still smoking 2 cigarettes every other day or so.  He does not have ashtrays any longer.  Has not tried nicotine lozenges or gum. Discussed this is his main risk factor for his arterial disease.      2.  Toenail onychomycosis:  Would like to start Terbinafine.    3.  Weight loss:  good appetite.  States he is finicky, however.  Does not have a problem so much with early satiety.  No melena or hematochezia.  No abdominal pain.  Breathing is fine without cough.  No chest pain.  Energy is good.  He is more physically active now that his legs have a better blood supply.   Current Meds  Medication Sig  . acetaminophen (TYLENOL) 500 MG tablet Take 500 mg by mouth every 6 (six) hours as needed for mild pain.  Marland Kitchen aspirin EC 81 MG tablet Take 1 tablet (81 mg total) by mouth daily.  Marland Kitchen atorvastatin (LIPITOR) 10 MG tablet Take 1 tablet (10 mg total) by mouth daily.  Marland Kitchen buPROPion (WELLBUTRIN XL) 150 MG 24 hr tablet 1 tab by mouth every morning  . lisinopril (ZESTRIL) 5 MG tablet Take 1 tablet by mouth once daily (Patient taking differently: Take 5 mg by mouth daily. )  . omeprazole  (PRILOSEC) 20 MG capsule 1 tab by mouth on empty stomach at bedtime.   No Known Allergies   Review of Systems    Objective:   BP 130/60 (BP Location: Left Arm, Patient Position: Sitting, Cuff Size: Normal)   Pulse 78   Resp 12   Ht 5\' 7"  (1.702 m)   Wt 121 lb (54.9 kg)   BMI 18.95 kg/m   Physical Exam  NAD Lungs:  CTA CV:  RRR without murmur or rub.  Radial pulses normal and equal.  Still with decreased DP pulse on right.  Not able to definitively feel right popliteal pulse either.   Abd:  S, NT, No HSM or mass, + BS Feet:  Color fine.  Toenails thickened and discolored and plantar feet flaking.     Assessment & Plan  1.  PAD:  Plan as per Dr. , Vascular Surgery  2.  Tobacco abuse in patient with PAD:  Strongly urged to use nicotine gum or lozenge when has urge to smoke.  No cigarettes in house.  Can also utilize quit line for counselor when feels urge to smoke.    3.  Hypertension:  Fair control  Would like to see more in 120/70 range.  As he is  able to be more physically active, expect to decrease.  4.  Anemia:  CBC  5.  Elevated ALT:  CMP for recheck today.  6.  Tinea pedis and toenail onychomycosis:  Treated previously, but apparently reinfected.  CMP today.  Start Terbinafine 250 mg daily for 84 days.  Shoe and shower floor care discussed.  Follow up in 6 and 12 weeks with hepatic profile.  7.  Weight Loss:  No clear etiology, though could be due to recent pain from PAD.    8.  COVID vaccination Tuesday, June 1 at 9 a.m.

## 2020-03-19 ENCOUNTER — Encounter (HOSPITAL_COMMUNITY): Payer: Self-pay | Admitting: Emergency Medicine

## 2020-03-19 ENCOUNTER — Inpatient Hospital Stay (HOSPITAL_COMMUNITY)
Admission: EM | Admit: 2020-03-19 | Discharge: 2020-03-23 | DRG: 253 | Disposition: A | Discharge status: Home / Self Care | Payer: Medicaid Other | Attending: Surgery | Admitting: Surgery

## 2020-03-19 DIAGNOSIS — I10 Essential (primary) hypertension: Secondary | ICD-10-CM | POA: Diagnosis present

## 2020-03-19 DIAGNOSIS — Z789 Other specified health status: Secondary | ICD-10-CM

## 2020-03-19 DIAGNOSIS — Z7982 Long term (current) use of aspirin: Secondary | ICD-10-CM

## 2020-03-19 DIAGNOSIS — R636 Underweight: Secondary | ICD-10-CM | POA: Diagnosis present

## 2020-03-19 DIAGNOSIS — R209 Unspecified disturbances of skin sensation: Secondary | ICD-10-CM

## 2020-03-19 DIAGNOSIS — Z86010 Personal history of colonic polyps: Secondary | ICD-10-CM

## 2020-03-19 DIAGNOSIS — I739 Peripheral vascular disease, unspecified: Secondary | ICD-10-CM | POA: Diagnosis present

## 2020-03-19 DIAGNOSIS — Z8249 Family history of ischemic heart disease and other diseases of the circulatory system: Secondary | ICD-10-CM

## 2020-03-19 DIAGNOSIS — Z79899 Other long term (current) drug therapy: Secondary | ICD-10-CM

## 2020-03-19 DIAGNOSIS — F17210 Nicotine dependence, cigarettes, uncomplicated: Secondary | ICD-10-CM | POA: Diagnosis present

## 2020-03-19 DIAGNOSIS — R262 Difficulty in walking, not elsewhere classified: Secondary | ICD-10-CM | POA: Diagnosis present

## 2020-03-19 DIAGNOSIS — Z681 Body mass index (BMI) 19 or less, adult: Secondary | ICD-10-CM

## 2020-03-19 DIAGNOSIS — N400 Enlarged prostate without lower urinary tract symptoms: Secondary | ICD-10-CM | POA: Diagnosis present

## 2020-03-19 DIAGNOSIS — Z419 Encounter for procedure for purposes other than remedying health state, unspecified: Secondary | ICD-10-CM

## 2020-03-19 DIAGNOSIS — I70229 Atherosclerosis of native arteries of extremities with rest pain, unspecified extremity: Principal | ICD-10-CM | POA: Diagnosis present

## 2020-03-19 DIAGNOSIS — F329 Major depressive disorder, single episode, unspecified: Secondary | ICD-10-CM | POA: Diagnosis present

## 2020-03-19 DIAGNOSIS — Z20822 Contact with and (suspected) exposure to covid-19: Secondary | ICD-10-CM | POA: Diagnosis present

## 2020-03-19 MED ORDER — HYDROMORPHONE HCL 1 MG/ML IJ SOLN
1.0000 mg | Freq: Once | INTRAMUSCULAR | Status: AC
  Administered 2020-03-20: 1 mg via INTRAVENOUS
  Filled 2020-03-19: qty 1

## 2020-03-19 MED ORDER — SODIUM CHLORIDE 0.9 % IV BOLUS
1000.0000 mL | Freq: Once | INTRAVENOUS | Status: AC
  Administered 2020-03-20: 1000 mL via INTRAVENOUS

## 2020-03-19 NOTE — ED Triage Notes (Signed)
BIB EMS from home. Reports sudden onset of R leg pain earlier this evening. Patient had bypass graft axilla-bifemoral (R) 01/19/20. VSS.
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
## 2020-03-20 ENCOUNTER — Emergency Department (HOSPITAL_COMMUNITY): Payer: Medicaid Other

## 2020-03-20 ENCOUNTER — Inpatient Hospital Stay (HOSPITAL_COMMUNITY): Payer: Medicaid Other

## 2020-03-20 DIAGNOSIS — R262 Difficulty in walking, not elsewhere classified: Secondary | ICD-10-CM | POA: Diagnosis present

## 2020-03-20 DIAGNOSIS — Z20822 Contact with and (suspected) exposure to covid-19: Secondary | ICD-10-CM | POA: Diagnosis present

## 2020-03-20 DIAGNOSIS — I739 Peripheral vascular disease, unspecified: Secondary | ICD-10-CM | POA: Diagnosis present

## 2020-03-20 DIAGNOSIS — Z681 Body mass index (BMI) 19 or less, adult: Secondary | ICD-10-CM | POA: Diagnosis not present

## 2020-03-20 DIAGNOSIS — Z8249 Family history of ischemic heart disease and other diseases of the circulatory system: Secondary | ICD-10-CM | POA: Diagnosis not present

## 2020-03-20 DIAGNOSIS — Z01810 Encounter for preprocedural cardiovascular examination: Secondary | ICD-10-CM | POA: Diagnosis not present

## 2020-03-20 DIAGNOSIS — F329 Major depressive disorder, single episode, unspecified: Secondary | ICD-10-CM | POA: Diagnosis present

## 2020-03-20 DIAGNOSIS — I998 Other disorder of circulatory system: Secondary | ICD-10-CM | POA: Diagnosis not present

## 2020-03-20 DIAGNOSIS — Z7982 Long term (current) use of aspirin: Secondary | ICD-10-CM | POA: Diagnosis not present

## 2020-03-20 DIAGNOSIS — N400 Enlarged prostate without lower urinary tract symptoms: Secondary | ICD-10-CM | POA: Diagnosis present

## 2020-03-20 DIAGNOSIS — Z86010 Personal history of colonic polyps: Secondary | ICD-10-CM | POA: Diagnosis not present

## 2020-03-20 DIAGNOSIS — I10 Essential (primary) hypertension: Secondary | ICD-10-CM | POA: Diagnosis present

## 2020-03-20 DIAGNOSIS — R636 Underweight: Secondary | ICD-10-CM | POA: Diagnosis present

## 2020-03-20 DIAGNOSIS — I70229 Atherosclerosis of native arteries of extremities with rest pain, unspecified extremity: Secondary | ICD-10-CM | POA: Diagnosis present

## 2020-03-20 DIAGNOSIS — F17210 Nicotine dependence, cigarettes, uncomplicated: Secondary | ICD-10-CM | POA: Diagnosis present

## 2020-03-20 DIAGNOSIS — Z79899 Other long term (current) drug therapy: Secondary | ICD-10-CM | POA: Diagnosis not present

## 2020-03-20 LAB — COMPREHENSIVE METABOLIC PANEL
ALT: 45 U/L — ABNORMAL HIGH (ref 0–44)
AST: 57 U/L — ABNORMAL HIGH (ref 15–41)
Albumin: 2.9 g/dL — ABNORMAL LOW (ref 3.5–5.0)
Alkaline Phosphatase: 140 U/L — ABNORMAL HIGH (ref 38–126)
Anion gap: 9 (ref 5–15)
BUN: <5 mg/dL — ABNORMAL LOW (ref 6–20)
CO2: 20 mmol/L — ABNORMAL LOW (ref 22–32)
Calcium: 8.2 mg/dL — ABNORMAL LOW (ref 8.9–10.3)
Chloride: 104 mmol/L (ref 98–111)
Creatinine, Ser: 0.56 mg/dL — ABNORMAL LOW (ref 0.61–1.24)
GFR calc Af Amer: >60 mL/min (ref >60)
GFR calc non Af Amer: >60 mL/min (ref >60)
Glucose, Bld: 78 mg/dL (ref 70–99)
Potassium: 4 mmol/L (ref 3.5–5.1)
Sodium: 133 mmol/L — ABNORMAL LOW (ref 135–145)
Total Bilirubin: 0.6 mg/dL (ref 0.3–1.2)
Total Protein: 6.2 g/dL — ABNORMAL LOW (ref 6.5–8.1)

## 2020-03-20 LAB — CBC
HCT: 36 % — ABNORMAL LOW (ref 39.0–52.0)
HCT: 37.2 % — ABNORMAL LOW (ref 39.0–52.0)
Hemoglobin: 12.7 g/dL — ABNORMAL LOW (ref 13.0–17.0)
Hemoglobin: 12.7 g/dL — ABNORMAL LOW (ref 13.0–17.0)
MCH: 31.5 pg (ref 26.0–34.0)
MCH: 31.9 pg (ref 26.0–34.0)
MCHC: 34.1 g/dL (ref 30.0–36.0)
MCHC: 35.3 g/dL (ref 30.0–36.0)
MCV: 90.5 fL (ref 80.0–100.0)
MCV: 92.3 fL (ref 80.0–100.0)
Platelets: 172 10*3/uL (ref 150–400)
Platelets: 209 10*3/uL (ref 150–400)
RBC: 3.98 MIL/uL — ABNORMAL LOW (ref 4.22–5.81)
RBC: 4.03 MIL/uL — ABNORMAL LOW (ref 4.22–5.81)
RDW: 14.4 % (ref 11.5–15.5)
RDW: 14.4 % (ref 11.5–15.5)
WBC: 5.3 10*3/uL (ref 4.0–10.5)
WBC: 5.8 10*3/uL (ref 4.0–10.5)
nRBC: 0 % (ref 0.0–0.2)
nRBC: 0 % (ref 0.0–0.2)

## 2020-03-20 LAB — I-STAT CHEM 8, ED
BUN: <3 mg/dL — ABNORMAL LOW (ref 6–20)
Calcium, Ion: 1.05 mmol/L — ABNORMAL LOW (ref 1.15–1.40)
Chloride: 102 mmol/L (ref 98–111)
Creatinine, Ser: 0.8 mg/dL (ref 0.61–1.24)
Glucose, Bld: 89 mg/dL (ref 70–99)
HCT: 37 % — ABNORMAL LOW (ref 39.0–52.0)
Hemoglobin: 12.6 g/dL — ABNORMAL LOW (ref 13.0–17.0)
Potassium: 4.1 mmol/L (ref 3.5–5.1)
Sodium: 134 mmol/L — ABNORMAL LOW (ref 135–145)
TCO2: 23 mmol/L (ref 22–32)

## 2020-03-20 LAB — LACTIC ACID, PLASMA
Lactic Acid, Venous: 2.5 mmol/L (ref 0.5–1.9)
Lactic Acid, Venous: 1.8 mmol/L (ref 0.5–1.9)

## 2020-03-20 LAB — HIV ANTIBODY (ROUTINE TESTING W REFLEX): HIV Screen 4th Generation wRfx: NONREACTIVE

## 2020-03-20 LAB — TYPE AND SCREEN
ABO/RH(D): O POS
Antibody Screen: NEGATIVE

## 2020-03-20 LAB — PROTIME-INR
INR: 1 (ref 0.8–1.2)
INR: 1.2 (ref 0.8–1.2)
Prothrombin Time: 12.6 seconds (ref 11.4–15.2)
Prothrombin Time: 14.4 seconds (ref 11.4–15.2)

## 2020-03-20 LAB — SARS CORONAVIRUS 2 BY RT PCR (HOSPITAL ORDER, PERFORMED IN ~~LOC~~ HOSPITAL LAB): SARS Coronavirus 2: NEGATIVE

## 2020-03-20 LAB — HEPARIN LEVEL (UNFRACTIONATED)
Heparin Unfractionated: 0.55 IU/mL (ref 0.30–0.70)
Heparin Unfractionated: 0.52 IU/mL (ref 0.30–0.70)

## 2020-03-20 MED ORDER — MORPHINE SULFATE (PF) 2 MG/ML IV SOLN
2.0000 mg | INTRAVENOUS | Status: DC | PRN
  Administered 2020-03-20 (×2): 2 mg via INTRAVENOUS
  Filled 2020-03-20 (×2): qty 1

## 2020-03-20 MED ORDER — DOCUSATE SODIUM 100 MG PO CAPS
100.0000 mg | ORAL_CAPSULE | Freq: Two times a day (BID) | ORAL | Status: DC
  Administered 2020-03-22: 100 mg via ORAL
  Filled 2020-03-20 (×2): qty 1

## 2020-03-20 MED ORDER — ONDANSETRON HCL 4 MG/2ML IJ SOLN: 4.0000 mg | Freq: Four times a day (QID) | INTRAMUSCULAR | Status: DC | PRN

## 2020-03-20 MED ORDER — BUPROPION HCL ER (XL) 150 MG PO TB24: 150.0000 mg | ORAL_TABLET | Freq: Every day | ORAL | Status: DC

## 2020-03-20 MED ORDER — GUAIFENESIN-DM 100-10 MG/5ML PO SYRP: 15.0000 mL | ORAL_SOLUTION | ORAL | Status: DC | PRN

## 2020-03-20 MED ORDER — PANTOPRAZOLE SODIUM 40 MG PO TBEC
40.0000 mg | DELAYED_RELEASE_TABLET | Freq: Every day | ORAL | Status: DC
  Administered 2020-03-20 – 2020-03-23 (×2): 40 mg via ORAL
  Filled 2020-03-20 (×3): qty 1

## 2020-03-20 MED ORDER — ACETAMINOPHEN 325 MG PO TABS
325.0000 mg | ORAL_TABLET | ORAL | Status: DC | PRN
  Administered 2020-03-20 – 2020-03-22 (×3): 650 mg via ORAL
  Filled 2020-03-20 (×3): qty 2

## 2020-03-20 MED ORDER — ASPIRIN EC 81 MG PO TBEC
81.0000 mg | DELAYED_RELEASE_TABLET | Freq: Every day | ORAL | Status: DC
  Administered 2020-03-20 – 2020-03-23 (×3): 81 mg via ORAL
  Filled 2020-03-20 (×3): qty 1

## 2020-03-20 MED ORDER — SODIUM CHLORIDE 0.9 % IV SOLN: INTRAVENOUS | Status: DC

## 2020-03-20 MED ORDER — HEPARIN BOLUS VIA INFUSION
3500.0000 [IU] | Freq: Once | INTRAVENOUS | Status: AC
  Administered 2020-03-20: 3500 [IU] via INTRAVENOUS
  Filled 2020-03-20: qty 3500

## 2020-03-20 MED ORDER — HYDRALAZINE HCL 20 MG/ML IJ SOLN: 5.0000 mg | INTRAMUSCULAR | Status: DC | PRN

## 2020-03-20 MED ORDER — SENNA 8.6 MG PO TABS
1.0000 | ORAL_TABLET | Freq: Two times a day (BID) | ORAL | Status: DC
  Administered 2020-03-22: 8.6 mg via ORAL
  Filled 2020-03-20 (×2): qty 1

## 2020-03-20 MED ORDER — PANTOPRAZOLE SODIUM 40 MG PO TBEC: 40.0000 mg | DELAYED_RELEASE_TABLET | Freq: Every day | ORAL | Status: DC

## 2020-03-20 MED ORDER — ATORVASTATIN CALCIUM 10 MG PO TABS
10.0000 mg | ORAL_TABLET | Freq: Every day | ORAL | Status: DC
  Administered 2020-03-20 – 2020-03-23 (×3): 10 mg via ORAL
  Filled 2020-03-20 (×3): qty 1

## 2020-03-20 MED ORDER — OXYCODONE HCL 5 MG PO TABS
5.0000 mg | ORAL_TABLET | ORAL | Status: DC | PRN
  Administered 2020-03-22: 5 mg via ORAL
  Filled 2020-03-20: qty 2

## 2020-03-20 MED ORDER — ACETAMINOPHEN 325 MG RE SUPP
325.0000 mg | RECTAL | Status: DC | PRN
  Filled 2020-03-20: qty 2

## 2020-03-20 MED ORDER — IOHEXOL 350 MG/ML SOLN
100.0000 mL | Freq: Once | INTRAVENOUS | Status: AC | PRN
  Administered 2020-03-20: 100 mL via INTRAVENOUS

## 2020-03-20 MED ORDER — METOPROLOL TARTRATE 5 MG/5ML IV SOLN: 2.0000 mg | INTRAVENOUS | Status: DC | PRN

## 2020-03-20 MED ORDER — PHENOL 1.4 % MT LIQD
1.0000 | OROMUCOSAL | Status: DC | PRN
  Filled 2020-03-20: qty 177

## 2020-03-20 MED ORDER — LISINOPRIL 5 MG PO TABS
5.0000 mg | ORAL_TABLET | Freq: Every day | ORAL | Status: DC
  Administered 2020-03-20 – 2020-03-23 (×3): 5 mg via ORAL
  Filled 2020-03-20 (×3): qty 1

## 2020-03-20 MED ORDER — LABETALOL HCL 5 MG/ML IV SOLN: 10.0000 mg | INTRAVENOUS | Status: DC | PRN

## 2020-03-20 MED ORDER — POTASSIUM CHLORIDE CRYS ER 20 MEQ PO TBCR: 20.0000 meq | EXTENDED_RELEASE_TABLET | Freq: Once | ORAL | Status: DC

## 2020-03-20 MED ORDER — ALUM & MAG HYDROXIDE-SIMETH 200-200-20 MG/5ML PO SUSP: 15.0000 mL | ORAL | Status: DC | PRN

## 2020-03-20 MED ORDER — ZOLPIDEM TARTRATE 5 MG PO TABS
5.0000 mg | ORAL_TABLET | Freq: Every evening | ORAL | Status: DC | PRN
  Filled 2020-03-20: qty 1

## 2020-03-20 MED ORDER — HEPARIN (PORCINE) 25000 UT/250ML-% IV SOLN
900.0000 [IU]/h | INTRAVENOUS | Status: DC
  Administered 2020-03-20 – 2020-03-21 (×2): 900 [IU]/h via INTRAVENOUS
  Filled 2020-03-20 (×2): qty 250

## 2020-03-20 MED ORDER — TERBINAFINE HCL 250 MG PO TABS
250.0000 mg | ORAL_TABLET | Freq: Every day | ORAL | Status: DC
  Filled 2020-03-20 (×5): qty 1
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
## 2020-03-20 NOTE — Progress Notes (Addendum)
  Progress Note    03/20/2020 8:39 AM * No surgery found *  Subjective:  Pain in right foot slightly better. Reports increased appearance of veins in right leg which is new over past few days   Vitals:   03/20/20 0700 03/20/20 0804  BP: (!) 164/73 (!) 182/68  Pulse: 79 79  Resp: 14 15  Temp:  98.4 F (36.9 C)  SpO2: 99% 95%   Physical Exam: Cardiac:  regular Lungs:  Non labored Extremities:  Bilateral lower extremities well perfused and warm. Right foot remains tender to palpation. Doppler PT/ AT bilaterally Neurologic: alert and oriented  CBC    Component Value Date/Time   WBC 5.3 03/20/2020 0302   RBC 4.03 (L) 03/20/2020 0302   HGB 12.7 (L) 03/20/2020 0302   HGB 14.8 08/05/2019 1136   HCT 37.2 (L) 03/20/2020 0302   HCT 44.1 08/05/2019 1136   PLT 172 03/20/2020 0302   PLT 251 08/05/2019 1136   MCV 92.3 03/20/2020 0302   MCV 96 08/05/2019 1136   MCH 31.5 03/20/2020 0302   MCHC 34.1 03/20/2020 0302   RDW 14.4 03/20/2020 0302   RDW 11.9 08/05/2019 1136   LYMPHSABS 1.2 01/19/2020 2240   LYMPHSABS 2.4 08/05/2019 1136   MONOABS 0.7 01/19/2020 2240   EOSABS 0.0 01/19/2020 2240   EOSABS 0.1 08/05/2019 1136   BASOSABS 0.0 01/19/2020 2240   BASOSABS 0.0 08/05/2019 1136    BMET    Component Value Date/Time   NA 133 (L) 03/20/2020 0302   NA 139 08/05/2019 1136   K 4.0 03/20/2020 0302   CL 104 03/20/2020 0302   CO2 20 (L) 03/20/2020 0302   GLUCOSE 78 03/20/2020 0302   BUN <5 (L) 03/20/2020 0302   BUN 3 (L) 08/05/2019 1136   CREATININE 0.56 (L) 03/20/2020 0302   CALCIUM 8.2 (L) 03/20/2020 0302   GFRNONAA >60 03/20/2020 0302   GFRAA >60 03/20/2020 0302    INR    Component Value Date/Time   INR 1.2 03/20/2020 0302     Intake/Output Summary (Last 24 hours) at 03/20/2020 0839 Last data filed at 03/20/2020 0305 Gross per 24 hour  Intake 1000 ml  Output --  Net 1000 ml     Assessment/Plan:  59 y.o. male is s/p axillary bifemoral bypass graft with new  onset of worsening right foot pain.  CT with patent bypass graft. Bilateral lower extremities well perfused clinically with doppler PT/AT bilaterally. Was started on heparin gtt. Some improvement of pain in right foot overnight. Will continue IV heparin. Hypertensive this morning. Right lower extremity vein mapping and Venous DVT duplex pending. If pain in right foot is not improved may need fem- bk popliteal bypass  DVT prophylaxis:  Heparin gtt   Graceann Congress, PA-C [facility] [phone] 03/20/2020 8:39 AM   I agree with the above.  He has had slight improvement in his pain since the heparin has started.  He continue to have brisk pedal doppler signals.  He will get vein mapping today for possible fem pop bypass next week.  I will also rule out DVT.  Durene Cal
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
## 2020-03-20 NOTE — ED Notes (Signed)
Attempted report x1. 
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
## 2020-03-20 NOTE — Progress Notes (Signed)
VASCULAR LAB PRELIMINARY  PRELIMINARY  PRELIMINARY  PRELIMINARY  Right lower extremity venous duplex completed.    Preliminary report:  See CV proc for preliminary results.  Hesham Womac, RVT 03/20/2020, 11:17 AM

## 2020-03-20 NOTE — ED Notes (Signed)
SDU Breakfast Ordered 

## 2020-03-20 NOTE — Progress Notes (Signed)
VASCULAR LAB PRELIMINARY  PRELIMINARY  PRELIMINARY  PRELIMINARY  Bilateral saphenous vein mapping completed.    Preliminary report:  See CV proc for preliminary results.  KANADY, CANDACE, RVT 03/20/2020, 11:16 AM
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
## 2020-03-20 NOTE — ED Notes (Signed)
Lactic acid 2.5 

## 2020-03-21 LAB — CBC
HCT: 33.7 % — ABNORMAL LOW (ref 39.0–52.0)
Hemoglobin: 11.8 g/dL — ABNORMAL LOW (ref 13.0–17.0)
MCH: 31.8 pg (ref 26.0–34.0)
MCHC: 35 g/dL (ref 30.0–36.0)
MCV: 90.8 fL (ref 80.0–100.0)
Platelets: 163 10*3/uL (ref 150–400)
RBC: 3.71 MIL/uL — ABNORMAL LOW (ref 4.22–5.81)
RDW: 13.9 % (ref 11.5–15.5)
WBC: 4 10*3/uL (ref 4.0–10.5)
nRBC: 0 % (ref 0.0–0.2)

## 2020-03-21 LAB — HEPARIN LEVEL (UNFRACTIONATED): Heparin Unfractionated: 0.39 IU/mL (ref 0.30–0.70)

## 2020-03-21 MED ORDER — CEFAZOLIN SODIUM-DEXTROSE 1-4 GM/50ML-% IV SOLN
1.0000 g | INTRAVENOUS | Status: AC
  Administered 2020-03-22 (×2): 1 g via INTRAVENOUS
  Filled 2020-03-21 (×2): qty 50

## 2020-03-21 NOTE — Progress Notes (Signed)
    Subjective  -   Still with pain in his right foot Was able to walk yesterday   Physical Exam:  Right leg warm PT signal Neuro exam of right foot at baseline       Assessment/Plan:    Right leg pain:  suspect this is vascular in origin.  I discussed proceeding with a right femoral popliteal bypass tomorrow.  Continue IV heparin.  Details as well as risks and  Benefits of surgery discussed with patient/.  NPO after midnight  [name] Jaime [name] 03/21/2020 7:34 AM --  Vitals:   03/21/20 0344 03/21/20 0728  BP: (!) 149/65 (!) 145/63  Pulse: 92 74  Resp: 19 18  Temp: 98.3 F (36.8 C) 98 F (36.7 C)  SpO2: 98% 100%    Intake/Output Summary (Last 24 hours) at 03/21/2020 0734 Last data filed at 03/21/2020 0040 Gross per 24 hour  Intake 840 ml  Output 425 ml  Net 415 ml     Laboratory CBC    Component Value Date/Time   WBC 4.0 03/21/2020 0252   HGB 11.8 (L) 03/21/2020 0252   HGB 14.8 08/05/2019 1136   HCT 33.7 (L) 03/21/2020 0252   HCT 44.1 08/05/2019 1136   PLT 163 03/21/2020 0252   PLT 251 08/05/2019 1136    BMET    Component Value Date/Time   NA 133 (L) 03/20/2020 0302   NA 139 08/05/2019 1136   K 4.0 03/20/2020 0302   CL 104 03/20/2020 0302   CO2 20 (L) 03/20/2020 0302   GLUCOSE 78 03/20/2020 0302   BUN <5 (L) 03/20/2020 0302   BUN 3 (L) 08/05/2019 1136   CREATININE 0.56 (L) 03/20/2020 0302   CALCIUM 8.2 (L) 03/20/2020 0302   GFRNONAA >60 03/20/2020 0302   GFRAA >60 03/20/2020 0302    COAG Lab Results  Component Value Date   INR 1.2 03/20/2020   INR 1.0 03/19/2020   INR 0.9 01/14/2020   No results found for: PTT  Antibiotics Anti-infectives (From admission, onward)   Start     Dose/Rate Route Frequency Ordered Stop   03/20/20 1800  terbinafine (LAMISIL) tablet 250 mg     250 mg Oral Daily 03/20/20 1628         Jaime [name], M.D., [facility] [place] Office: [phone] Pager:  [phone]
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
## 2020-03-22 ENCOUNTER — Inpatient Hospital Stay (HOSPITAL_COMMUNITY): Payer: Medicaid Other

## 2020-03-22 ENCOUNTER — Inpatient Hospital Stay (HOSPITAL_COMMUNITY): Payer: Medicaid Other | Admitting: Certified Registered Nurse Anesthetist

## 2020-03-22 ENCOUNTER — Encounter (HOSPITAL_COMMUNITY)
Admission: EM | Disposition: A | Discharge status: Home / Self Care | Payer: Self-pay | Source: Home / Self Care | Attending: Surgery

## 2020-03-22 DIAGNOSIS — I998 Other disorder of circulatory system: Secondary | ICD-10-CM

## 2020-03-22 HISTORY — PX: FEMORAL-POPLITEAL BYPASS GRAFT: SHX937

## 2020-03-22 HISTORY — PX: LOWER EXTREMITY ANGIOGRAM: SHX5508

## 2020-03-22 HISTORY — PX: VEIN HARVEST: SHX6363

## 2020-03-22 LAB — BASIC METABOLIC PANEL
Anion gap: 7 (ref 5–15)
BUN: <5 mg/dL — ABNORMAL LOW (ref 6–20)
CO2: 23 mmol/L (ref 22–32)
Calcium: 8.2 mg/dL — ABNORMAL LOW (ref 8.9–10.3)
Chloride: 103 mmol/L (ref 98–111)
Creatinine, Ser: 0.49 mg/dL — ABNORMAL LOW (ref 0.61–1.24)
GFR calc Af Amer: >60 mL/min (ref >60)
GFR calc non Af Amer: >60 mL/min (ref >60)
Glucose, Bld: 99 mg/dL (ref 70–99)
Potassium: 3.3 mmol/L — ABNORMAL LOW (ref 3.5–5.1)
Sodium: 133 mmol/L — ABNORMAL LOW (ref 135–145)

## 2020-03-22 LAB — CBC
HCT: 32.4 % — ABNORMAL LOW (ref 39.0–52.0)
Hemoglobin: 11.4 g/dL — ABNORMAL LOW (ref 13.0–17.0)
MCH: 31.9 pg (ref 26.0–34.0)
MCHC: 35.2 g/dL (ref 30.0–36.0)
MCV: 90.8 fL (ref 80.0–100.0)
Platelets: 159 10*3/uL (ref 150–400)
RBC: 3.57 MIL/uL — ABNORMAL LOW (ref 4.22–5.81)
RDW: 13.6 % (ref 11.5–15.5)
WBC: 3.8 10*3/uL — ABNORMAL LOW (ref 4.0–10.5)
nRBC: 0 % (ref 0.0–0.2)

## 2020-03-22 LAB — PROTIME-INR
INR: 1.1 (ref 0.8–1.2)
Prothrombin Time: 13.7 seconds (ref 11.4–15.2)

## 2020-03-22 LAB — HEPARIN LEVEL (UNFRACTIONATED): Heparin Unfractionated: 0.39 IU/mL (ref 0.30–0.70)

## 2020-03-22 LAB — POCT ACTIVATED CLOTTING TIME: Activated Clotting Time: 285 seconds

## 2020-03-22 SURGERY — BYPASS GRAFT FEMORAL-POPLITEAL ARTERY
Anesthesia: General | Site: Leg Upper | Laterality: Right

## 2020-03-22 MED ORDER — FENTANYL CITRATE (PF) 250 MCG/5ML IJ SOLN
INTRAMUSCULAR | Status: AC
  Filled 2020-03-22: qty 5

## 2020-03-22 MED ORDER — IODIXANOL 320 MG/ML IV SOLN
INTRAVENOUS | Status: DC | PRN
  Administered 2020-03-22: 16 mL

## 2020-03-22 MED ORDER — PROPOFOL 10 MG/ML IV BOLUS
INTRAVENOUS | Status: AC
  Filled 2020-03-22: qty 20

## 2020-03-22 MED ORDER — SODIUM CHLORIDE 0.9 % IV SOLN
INTRAVENOUS | Status: DC | PRN
  Administered 2020-03-22: 500 mL

## 2020-03-22 MED ORDER — LACTATED RINGERS IV SOLN: INTRAVENOUS | Status: DC

## 2020-03-22 MED ORDER — MIDAZOLAM HCL 2 MG/2ML IJ SOLN
INTRAMUSCULAR | Status: AC
  Filled 2020-03-22: qty 2

## 2020-03-22 MED ORDER — THROMBIN (RECOMBINANT) 20000 UNITS EX SOLR
CUTANEOUS | Status: AC
  Filled 2020-03-22: qty 20000

## 2020-03-22 MED ORDER — HEPARIN SODIUM (PORCINE) 1000 UNIT/ML IJ SOLN
INTRAMUSCULAR | Status: DC | PRN
  Administered 2020-03-22: 6000 [IU] via INTRAVENOUS

## 2020-03-22 MED ORDER — PROPOFOL 10 MG/ML IV BOLUS
INTRAVENOUS | Status: DC | PRN
  Administered 2020-03-22: 200 mg via INTRAVENOUS

## 2020-03-22 MED ORDER — FENTANYL CITRATE (PF) 100 MCG/2ML IJ SOLN
25.0000 ug | INTRAMUSCULAR | Status: DC | PRN
  Administered 2020-03-22: 50 ug via INTRAVENOUS

## 2020-03-22 MED ORDER — ROCURONIUM BROMIDE 10 MG/ML (PF) SYRINGE
PREFILLED_SYRINGE | INTRAVENOUS | Status: DC | PRN
  Administered 2020-03-22: 20 mg via INTRAVENOUS
  Administered 2020-03-22: 50 mg via INTRAVENOUS
  Administered 2020-03-22: 20 mg via INTRAVENOUS

## 2020-03-22 MED ORDER — DEXAMETHASONE SODIUM PHOSPHATE 10 MG/ML IJ SOLN
INTRAMUSCULAR | Status: DC | PRN
  Administered 2020-03-22: 10 mg via INTRAVENOUS

## 2020-03-22 MED ORDER — CHLORHEXIDINE GLUCONATE 0.12 % MT SOLN
15.0000 mL | Freq: Once | OROMUCOSAL | Status: AC
  Filled 2020-03-22: qty 15

## 2020-03-22 MED ORDER — ACETAMINOPHEN 10 MG/ML IV SOLN
INTRAVENOUS | Status: AC
  Filled 2020-03-22: qty 100

## 2020-03-22 MED ORDER — SUGAMMADEX SODIUM 200 MG/2ML IV SOLN
INTRAVENOUS | Status: DC | PRN
  Administered 2020-03-22: 200 mg via INTRAVENOUS

## 2020-03-22 MED ORDER — PAPAVERINE HCL 30 MG/ML IJ SOLN
INTRAMUSCULAR | Status: AC
  Filled 2020-03-22: qty 2

## 2020-03-22 MED ORDER — LIDOCAINE 2% (20 MG/ML) 5 ML SYRINGE
INTRAMUSCULAR | Status: DC | PRN
  Administered 2020-03-22: 60 mg via INTRAVENOUS

## 2020-03-22 MED ORDER — ONDANSETRON HCL 4 MG/2ML IJ SOLN: 4.0000 mg | Freq: Four times a day (QID) | INTRAMUSCULAR | Status: DC | PRN

## 2020-03-22 MED ORDER — CHLORHEXIDINE GLUCONATE 0.12 % MT SOLN
OROMUCOSAL | Status: AC
  Administered 2020-03-22: 15 mL via OROMUCOSAL
  Filled 2020-03-22: qty 15

## 2020-03-22 MED ORDER — ESMOLOL HCL 100 MG/10ML IV SOLN
INTRAVENOUS | Status: DC | PRN
  Administered 2020-03-22 (×2): 20 mg via INTRAVENOUS

## 2020-03-22 MED ORDER — FENTANYL CITRATE (PF) 100 MCG/2ML IJ SOLN
INTRAMUSCULAR | Status: AC
  Filled 2020-03-22: qty 2

## 2020-03-22 MED ORDER — CHLORHEXIDINE GLUCONATE CLOTH 2 % EX PADS
6.0000 | MEDICATED_PAD | Freq: Every day | CUTANEOUS | Status: DC
  Administered 2020-03-22: 6 via TOPICAL

## 2020-03-22 MED ORDER — PAPAVERINE HCL 30 MG/ML IJ SOLN
INTRAMUSCULAR | Status: DC | PRN
  Administered 2020-03-22: 2 mL

## 2020-03-22 MED ORDER — SODIUM CHLORIDE 0.9 % IV SOLN
INTRAVENOUS | Status: AC
  Filled 2020-03-22: qty 1.2

## 2020-03-22 MED ORDER — MIDAZOLAM HCL 2 MG/2ML IJ SOLN
INTRAMUSCULAR | Status: DC | PRN
  Administered 2020-03-22: 2 mg via INTRAVENOUS

## 2020-03-22 MED ORDER — PHENYLEPHRINE HCL (PRESSORS) 10 MG/ML IV SOLN
INTRAVENOUS | Status: DC | PRN
  Administered 2020-03-22 (×3): 80 ug via INTRAVENOUS

## 2020-03-22 MED ORDER — OXYCODONE HCL 5 MG/5ML PO SOLN: 5.0000 mg | Freq: Once | ORAL | Status: DC | PRN

## 2020-03-22 MED ORDER — ONDANSETRON HCL 4 MG/2ML IJ SOLN
INTRAMUSCULAR | Status: DC | PRN
  Administered 2020-03-22: 4 mg via INTRAVENOUS

## 2020-03-22 MED ORDER — PROTAMINE SULFATE 10 MG/ML IV SOLN
INTRAVENOUS | Status: DC | PRN
Start: 2020-03-22 — End: 2020-03-22
  Administered 2020-03-22: 30 mg via INTRAVENOUS

## 2020-03-22 MED ORDER — PHENYLEPHRINE HCL-NACL 10-0.9 MG/250ML-% IV SOLN
INTRAVENOUS | Status: DC | PRN
  Administered 2020-03-22: 15 ug/min via INTRAVENOUS

## 2020-03-22 MED ORDER — OXYCODONE HCL 5 MG PO TABS: 5.0000 mg | ORAL_TABLET | Freq: Once | ORAL | Status: DC | PRN

## 2020-03-22 MED ORDER — FENTANYL CITRATE (PF) 250 MCG/5ML IJ SOLN
INTRAMUSCULAR | Status: DC | PRN
  Administered 2020-03-22: 50 ug via INTRAVENOUS
  Administered 2020-03-22: 25 ug via INTRAVENOUS
  Administered 2020-03-22: 50 ug via INTRAVENOUS
  Administered 2020-03-22: 25 ug via INTRAVENOUS
  Administered 2020-03-22 (×2): 50 ug via INTRAVENOUS
  Administered 2020-03-22: 100 ug via INTRAVENOUS
  Administered 2020-03-22: 25 ug via INTRAVENOUS
  Administered 2020-03-22 (×2): 50 ug via INTRAVENOUS
  Administered 2020-03-22: 25 ug via INTRAVENOUS

## 2020-03-22 MED ORDER — 0.9 % SODIUM CHLORIDE (POUR BTL) OPTIME
TOPICAL | Status: DC | PRN
  Administered 2020-03-22 (×2): 1000 mL

## 2020-03-22 MED ORDER — ACETAMINOPHEN 10 MG/ML IV SOLN
INTRAVENOUS | Status: DC | PRN
Start: 2020-03-22 — End: 2020-03-22
  Administered 2020-03-22: 1000 mg via INTRAVENOUS

## 2020-03-22 MED ORDER — OXYCODONE HCL 5 MG PO TABS
ORAL_TABLET | ORAL | Status: AC
  Filled 2020-03-22: qty 2

## 2020-03-22 MED ORDER — HEMOSTATIC AGENTS (NO CHARGE) OPTIME
TOPICAL | Status: DC | PRN
  Administered 2020-03-22: 1 via TOPICAL

## 2020-03-22 SURGICAL SUPPLY — 73 items
BANDAGE ESMARK 6X9 LF (GAUZE/BANDAGES/DRESSINGS) IMPLANT
BNDG ESMARK 6X9 LF (GAUZE/BANDAGES/DRESSINGS) ×4
CANISTER SUCT 3000ML PPV (MISCELLANEOUS) ×4 IMPLANT
CANNULA VESSEL 3MM 2 BLNT TIP (CANNULA) ×8 IMPLANT
CATH EMB 4FR 40CM (CATHETERS) ×2 IMPLANT
CLIP VESOCCLUDE MED 24/CT (CLIP) ×4 IMPLANT
CLIP VESOCCLUDE SM WIDE 24/CT (CLIP) ×8 IMPLANT
COVER PROBE W GEL 5X96 (DRAPES) IMPLANT
CUFF TOURN SGL QUICK 24 (TOURNIQUET CUFF) ×2
CUFF TOURN SGL QUICK 34 (TOURNIQUET CUFF)
CUFF TOURN SGL QUICK 42 (TOURNIQUET CUFF) IMPLANT
CUFF TRNQT CYL 24X4X16.5-23 (TOURNIQUET CUFF) IMPLANT
CUFF TRNQT CYL 34X4.125X (TOURNIQUET CUFF) IMPLANT
DERMABOND ADVANCED (GAUZE/BANDAGES/DRESSINGS) ×4
DERMABOND ADVANCED .7 DNX12 (GAUZE/BANDAGES/DRESSINGS) ×2 IMPLANT
DRAIN CHANNEL 15F RND FF W/TCR (WOUND CARE) IMPLANT
DRAPE HALF SHEET 40X57 (DRAPES) IMPLANT
DRAPE X-RAY CASS 24X20 (DRAPES) ×2 IMPLANT
ELECT REM PT RETURN 9FT ADLT (ELECTROSURGICAL) ×4
ELECTRODE REM PT RTRN 9FT ADLT (ELECTROSURGICAL) ×2 IMPLANT
EVACUATOR SILICONE 100CC (DRAIN) IMPLANT
GAUZE 4X4 16PLY RFD (DISPOSABLE) ×2 IMPLANT
GLOVE BIO SURGEON STRL SZ 6.5 (GLOVE) ×4 IMPLANT
GLOVE BIO SURGEON STRL SZ7.5 (GLOVE) ×4 IMPLANT
GLOVE BIO SURGEONS STRL SZ 6.5 (GLOVE) ×4
GLOVE BIOGEL PI IND STRL 6.5 (GLOVE) IMPLANT
GLOVE BIOGEL PI IND STRL 7.0 (GLOVE) IMPLANT
GLOVE BIOGEL PI IND STRL 7.5 (GLOVE) IMPLANT
GLOVE BIOGEL PI IND STRL 8 (GLOVE) ×2 IMPLANT
GLOVE BIOGEL PI INDICATOR 6.5 (GLOVE) ×8
GLOVE BIOGEL PI INDICATOR 7.0 (GLOVE) ×2
GLOVE BIOGEL PI INDICATOR 7.5 (GLOVE) ×6
GLOVE BIOGEL PI INDICATOR 8 (GLOVE) ×2
GLOVE ECLIPSE 7.0 STRL STRAW (GLOVE) ×4 IMPLANT
GLOVE SURG SS PI 6.5 STRL IVOR (GLOVE) ×2 IMPLANT
GOWN STRL REUS W/ TWL LRG LVL3 (GOWN DISPOSABLE) ×6 IMPLANT
GOWN STRL REUS W/ TWL XL LVL3 (GOWN DISPOSABLE) IMPLANT
GOWN STRL REUS W/TWL LRG LVL3 (GOWN DISPOSABLE) ×18
GOWN STRL REUS W/TWL XL LVL3 (GOWN DISPOSABLE) ×6
KIT BASIN OR (CUSTOM PROCEDURE TRAY) ×4 IMPLANT
KIT TURNOVER KIT B (KITS) ×4 IMPLANT
MARKER GRAFT CORONARY BYPASS (MISCELLANEOUS) ×2 IMPLANT
NDL HYPO 25GX1X1/2 BEV (NEEDLE) IMPLANT
NEEDLE HYPO 25GX1X1/2 BEV (NEEDLE) ×4 IMPLANT
NS IRRIG 1000ML POUR BTL (IV SOLUTION) ×8 IMPLANT
PACK PERIPHERAL VASCULAR (CUSTOM PROCEDURE TRAY) ×4 IMPLANT
PAD ARMBOARD 7.5X6 YLW CONV (MISCELLANEOUS) ×8 IMPLANT
SET COLLECT BLD 21X3/4 12 (NEEDLE) IMPLANT
SET COLLECT BLD 21X3/4 12 PB (MISCELLANEOUS) ×2 IMPLANT
SPONGE LAP 18X18 RF (DISPOSABLE) ×2 IMPLANT
SPONGE SURGIFOAM ABS GEL 100 (HEMOSTASIS) IMPLANT
STOPCOCK 4 WAY LG BORE MALE ST (IV SETS) ×4 IMPLANT
SUT ETHILON 3 0 PS 1 (SUTURE) IMPLANT
SUT PROLENE 5 0 C 1 24 (SUTURE) ×4 IMPLANT
SUT PROLENE 6 0 BV (SUTURE) ×18 IMPLANT
SUT SILK 2 0 SH (SUTURE) ×4 IMPLANT
SUT SILK 3 0 (SUTURE) ×8
SUT SILK 3-0 18XBRD TIE 12 (SUTURE) IMPLANT
SUT VIC AB 2-0 CTB1 (SUTURE) ×8 IMPLANT
SUT VIC AB 3-0 SH 27 (SUTURE) ×10
SUT VIC AB 3-0 SH 27X BRD (SUTURE) ×4 IMPLANT
SUT VIC AB 4-0 PS2 27 (SUTURE) ×8 IMPLANT
SUT VICRYL 4-0 PS2 18IN ABS (SUTURE) ×8 IMPLANT
SYR 20ML LL LF (SYRINGE) ×4 IMPLANT
SYR 3ML LL SCALE MARK (SYRINGE) ×4 IMPLANT
TOWEL GREEN STERILE (TOWEL DISPOSABLE) ×4 IMPLANT
TOWEL GREEN STERILE FF (TOWEL DISPOSABLE) ×6 IMPLANT
TRAY FOLEY MTR SLVR 16FR STAT (SET/KITS/TRAYS/PACK) ×4 IMPLANT
TUBE CONNECTING 20'X1/4 (TUBING) ×1
TUBE CONNECTING 20X1/4 (TUBING) ×1 IMPLANT
TUBING EXTENTION W/L.L. (IV SETS) ×2 IMPLANT
UNDERPAD 30X36 HEAVY ABSORB (UNDERPADS AND DIAPERS) ×4 IMPLANT
WATER STERILE IRR 1000ML POUR (IV SOLUTION) ×4 IMPLANT

## 2020-03-22 NOTE — Anesthesia Procedure Notes (Signed)
Procedure Name: Intubation Date/Time: 03/22/2020 9:02 AM Performed by: Clearnce Sorrel, CRNA Pre-anesthesia Checklist: Patient identified, Emergency Drugs available, Suction available, Patient being monitored and Timeout performed Patient Re-evaluated:Patient Re-evaluated prior to induction Oxygen Delivery Method: Circle system utilized Preoxygenation: Pre-oxygenation with 100% oxygen Induction Type: IV induction Ventilation: Mask ventilation without difficulty Laryngoscope Size: Mac and 4 Grade View: Grade I Tube type: Oral Tube size: 7.5 mm Number of attempts: 1 Airway Equipment and Method: Stylet Placement Confirmation: ETT inserted through vocal cords under direct vision,  positive ETCO2 and breath sounds checked- equal and bilateral Secured at: 23 cm Tube secured with: Tape Dental Injury: Teeth and Oropharynx as per pre-operative assessment

## 2020-03-22 NOTE — Anesthesia Postprocedure Evaluation (Signed)
Anesthesia Post Note  Patient: Jaime [name]  Procedure(s) Performed: right FEMORAL to below knee POPLITEAL ARTERY BYPASS GRAFT using nonreversed translocated saphenous vein graft (Right Leg Lower) VEIN HARVEST, right greater saphenous vein (Right Leg Upper) intraoperative LOWER EXTREMITY ANGIOGRAM (Right Leg Lower)     Patient location during evaluation: PACU Anesthesia Type: General Level of consciousness: awake and alert Pain management: pain level controlled Vital Signs Assessment: post-procedure vital signs reviewed and stable Respiratory status: spontaneous breathing, nonlabored ventilation, respiratory function stable and patient connected to nasal cannula oxygen Cardiovascular status: blood pressure returned to baseline and stable Postop Assessment: no apparent nausea or vomiting Anesthetic complications: no    Last Vitals:  Vitals:   03/22/20 1700 03/22/20 1800  BP:    Pulse: 97 78  Resp: (!) 21 11  Temp:    SpO2: 100% 100%    Last Pain:  Vitals:   03/22/20 1800  TempSrc:   PainSc: 0-No pain                 Elai Vanwyk S

## 2020-03-22 NOTE — Op Note (Signed)
NAME: Jaime [name]    MRN: [id number] DOB: [birth year]    DATE OF OPERATION: 03/22/2020  PREOP DIAGNOSIS:    Critical limb ischemia right lower extremity  POSTOP DIAGNOSIS:    Same  PROCEDURE:    1. Redo right femoral artery exposure 2. Right femoral to below-knee popliteal artery bypass with nonreversed translocated saphenous vein graft 3. Intraoperative arteriogram  SURGEON: Di Kindle. Edilia Bo, MD  ASSIST: Clinton Gallant, PA  ANESTHESIA: General  EBL: Minimal  INDICATIONS:    Jaime [name] is a 59 y.o. male who is undergone a previous right axillofemoral and right to left femoral-femoral bypass.  The patient had progressive rest pain of the right foot and was admitted to the hospital for pain control.  It was felt that his only option for revascularization would be bypass to the below-knee popliteal artery which was somewhat small.  He presents for right femoropopliteal bypass grafting.  FINDINGS:   Brisk anterior tibial and posterior tibial signal with the Doppler.  TECHNIQUE:   The patient was taken to the operating room and received a general anesthetic.  Both groins and the entire right lower extremity were prepped and draped in usual sterile fashion.  The previous incision in the right groin was opened and through dense scar tissue I exposed the right limb of the femoral-femoral graft, the distal limb of the right axillofemoral graft, and the common femoral artery.  I exposed enough that I could clamp with a Satinsky around the distal bypass.  Next the saphenofemoral junction was dissected free and the saphenous vein was mobilized.  Using for additional incisions along the medial aspect of the left leg the great saphenous vein was harvested to the mid calf with branches divided between clips and 3-0 silk ties.  Through the distal incision the gastrocnemius muscle was retracted posteriorly and the below-knee popliteal artery was exposed.  The artery was soft and patent at this  level.  It was somewhat small.  A tunnel was then created from this incision to the groin incision and the patient was then heparinized.  Attention was first turned to the proximal anastomosis.  The great saphenous vein was ligated distally and proximally and then irrigated up with heparinized saline.  Was about a 4.5 mm vein.  The vein was used in a nonreversed fashion and the proximal aspect of the graft was spatulated past the valve and the valve was sharply excised.  Next I placed a Satinsky clamp across the distal aspect of the right axillofemoral graft however the graft was still pulsatile.  I tried multiple positions but was unsuccessful and successfully occluding the distal graft.  Therefore I elected to place it off the right limb of the femoral-femoral graft.  This was dissected free enough that I could clamp proximally and distally.  A longitudinal arteriotomy was made in the right limb of the femoral-femoral graft and the vein was spatulated and sewn end-to-side to the femoral-femoral graft using a continuous 6-0 Prolene suture.  Prior to completing this anastomosis the arteries were backbled and flushed appropriately and the anastomosis completed.  Next a retrograde [place] valvulotome was used to lyse the valves.  Multiple passes were made until no further valves were identified.  The vein was irrigated with heparinized saline and then clipped and then marked to prevent twisting.  It was then brought through the previously created tunnel for anastomosis to the below-knee popliteal artery.  A 24 tourniquet was placed on the thigh.  The leg was  exsanguinated with an Esmarch bandage.  The tourniquet was inflated to 300 mmHg.  Under tourniquet control a longitudinal arteriotomy was made in the below-knee popliteal artery.  The vein graft cut with appropriate length, spatulated and sewn end-to-side to the below-knee popliteal artery using continuous 6-0 Prolene suture.  Prior to completing this  anastomosis the artery was backbled and flushed appropriately and the anastomosis completed.  Flow was reestablished to the right leg.  There was a brisk anterior tibial and posterior tibial signal with the Doppler.  Next an intraoperative arteriogram was obtained by cannulating the proximal vein graft.  This showed no technical problems with two-vessel runoff via the anterior tibial and posterior tibial arteries.  The heparin was partially reversed with protamine.  Each of the vein harvest incisions was closed with a deep layer of 3-0 Vicryl and skin closed with 4-0 Vicryl after hemostasis was obtained.  The below the knee incision was closed with 2 deep layers of 3-0 Vicryl and the skin closed with 4-0 Vicryl.  The groin incision was closed with a deep layer of 2-0 Vicryl, a subcutaneous layer of 2-0 Vicryl and the skin closed with 4-0 Vicryl.  Dermabond was applied.  The patient tolerated the procedure well was transferred recovery room in stable condition.  All needle and sponge counts were correct.  [name], MD, FACS [facility]  DATE OF DICTATION:   03/22/2020

## 2020-03-22 NOTE — Progress Notes (Signed)
Foley cath d/ced.  Balloon deflated completely prior to d/c.  Patient tolerated well.   °Left A/C NSL d/ced.  Pressure held until bleeding stopped and secure dressing placed at site.  No c/o from patient.    °  °

## 2020-03-22 NOTE — Progress Notes (Signed)
ANTICOAGULATION CONSULT NOTE - Follow Up Consult  Pharmacy Consult for Heparin Indication: RLE occlusion  No Known Allergies  Patient Measurements: Height: 5\' 10"  (177.8 cm) Weight: 55.8 kg (123 lb) IBW/kg (Calculated) : 73 Heparin Dosing Weight: 55.8 kg  Vital Signs: Temp: 98.5 F (36.9 C) (06/01 0415) Temp Source: Oral (06/01 0415) BP: 154/70 (06/01 0415) Pulse Rate: 87 (06/01 0415)  Labs: Recent Labs    03/20/20 0204 03/20/20 0204 03/20/20 0302 03/20/20 0302 03/20/20 1137 03/20/20 1910 03/21/20 0252 03/22/20 0401  HGB 12.6*   < > 12.7*   < >  --   --  11.8* 11.4*  HCT 37.0*   < > 37.2*  --   --   --  33.7* 32.4*  PLT  --   --  172  --   --   --  163 159  LABPROT  --   --  14.4  --   --   --   --  13.7  INR  --   --  1.2  --   --   --   --  1.1  HEPARINUNFRC  --   --   --   --    < > 0.52 0.39 0.39  CREATININE 0.80  --  0.56*  --   --   --   --  0.49*   < > = values in this interval not displayed.    Estimated Creatinine Clearance: 79.4 mL/min (A) (by C-G formula based on SCr of 0.49 mg/dL (L)).   Assessment:  Anticoag: heparin for possible RLE occlusion (s/p bypass 01/19/20). No AC PTA. HL 0.39 in goal. Hgb 11.4. Plts 159 Dopplers: neg DVT -CTA: patent R bypass graft fem artery, disease R and L superficial fem artery  Goal of Therapy:  Heparin level 0.3-0.7 units/ml Monitor platelets by anticoagulation protocol: Yes   Plan:  Continue Heparin gtt at 900 units/hr Daily heparin level and CBC R femoral popliteal bypass planned 6/1 K+:    Jaime [name] S. 01/21/20, PharmD, BCPS Clinical Staff Pharmacist [email or web address] Jaime [name], Jaime [name] 03/22/2020,7:18 AM

## 2020-03-22 NOTE — Progress Notes (Signed)
° °  VASCULAR SURGERY POSTOP:   Doing well postop.  His right foot feels much better he has a palpable dorsalis pedis pulse.  Discontinue Foley  SUBJECTIVE:   His right foot feels much better  PHYSICAL EXAM:   Vitals:   03/22/20 1416 03/22/20 1500 03/22/20 1528 03/22/20 1600  BP: (!) 161/70  127/60   Pulse: 92 99 92 98  Resp: 17 16 15 18   Temp: 98 F (36.7 C)  97.7 F (36.5 C)   TempSrc: Oral  Oral   SpO2: 100% 100% 100% 100%  Weight:      Height:       Palpable right dorsalis pedis pulse Biphasic dorsalis pedis and posterior tibial signal with a Doppler  LABS:   Lab Results  Component Value Date   WBC 3.8 (L) 03/22/2020   HGB 11.4 (L) 03/22/2020   HCT 32.4 (L) 03/22/2020   MCV 90.8 03/22/2020   PLT 159 03/22/2020   Lab Results  Component Value Date   CREATININE 0.49 (L) 03/22/2020   Lab Results  Component Value Date   INR 1.1 03/22/2020    PROBLEM LIST:    Active Problems:   PAD (peripheral artery disease) (HCC)   CURRENT MEDS:    aspirin EC  81 mg Oral Daily   atorvastatin  10 mg Oral Daily   Chlorhexidine Gluconate Cloth  6 each Topical Daily   docusate sodium  100 mg Oral BID   lisinopril  5 mg Oral Daily   pantoprazole  40 mg Oral Daily   potassium chloride  20-40 mEq Oral Once   senna  1 tablet Oral BID   terbinafine  250 mg Oral Daily    05/22/2020 Office: [phone] 03/22/2020

## 2020-03-22 NOTE — Transfer of Care (Signed)
Immediate Anesthesia Transfer of Care Note  Patient: Jaime [name]  Procedure(s) Performed: BYPASS GRAFT FEMORAL-POPLITEAL ARTERY (Right ) VEIN HARVEST (Right Leg Upper) Lower Extremity Angiogram (Right Leg Lower)  Patient Location: PACU  Anesthesia Type:General  Level of Consciousness: awake, alert  and oriented  Airway & Oxygen Therapy: Patient Spontanous Breathing and Patient connected to nasal cannula oxygen  Post-op Assessment: Report given to RN and Post -op Vital signs reviewed and stable  Post vital signs: Reviewed and stable  Last Vitals:  Vitals Value Taken Time  BP 159/76 03/22/20 1331  Temp    Pulse 85 03/22/20 1334  Resp 12 03/22/20 1334  SpO2 100 % 03/22/20 1334  Vitals shown include unvalidated device data.  Last Pain:  Vitals:   03/22/20 0736  TempSrc: Oral  PainSc: 9       Patients Stated Pain Goal: 0 (03/20/20 2013)  Complications: No apparent anesthesia complications

## 2020-03-22 NOTE — Anesthesia Preprocedure Evaluation (Signed)
Anesthesia Evaluation  Patient identified by MRN, date of birth, ID band Patient awake    Reviewed: Allergy & Precautions, H&P , NPO status , Patient's Chart, lab work & pertinent test results  Airway Mallampati: II   Neck ROM: full    Dental   Pulmonary Current Smoker,    breath sounds clear to auscultation       Cardiovascular hypertension, + Peripheral Vascular Disease   Rhythm:regular Rate:Normal  H/o aortobifem   Neuro/Psych PSYCHIATRIC DISORDERS Depression  Neuromuscular disease    GI/Hepatic   Endo/Other    Renal/GU      Musculoskeletal   Abdominal   Peds  Hematology   Anesthesia Other Findings   Reproductive/Obstetrics                             Anesthesia Physical Anesthesia Plan  ASA: III  Anesthesia Plan: General   Post-op Pain Management:    Induction: Intravenous  PONV Risk Score and Plan: 1 and Ondansetron, Dexamethasone, Midazolam and Treatment may vary due to age or medical condition  Airway Management Planned: Oral ETT  Additional Equipment:   Intra-op Plan:   Post-operative Plan: Extubation in OR  Informed Consent: I have reviewed the patients History and Physical, chart, labs and discussed the procedure including the risks, benefits and alternatives for the proposed anesthesia with the patient or authorized representative who has indicated his/her understanding and acceptance.       Plan Discussed with: Anesthesiologist, CRNA and Surgeon  Anesthesia Plan Comments:         Anesthesia Quick Evaluation

## 2020-03-22 NOTE — Progress Notes (Signed)
VASCULAR SURGERY:  The patient was admitted with rest pain of the right foot.  He has limb threatening ischemia.  I think his best chance for limb salvage is right femoral to below-knee popliteal or posterior tibial bypass.  Vein map shows that the vein distally is marginal although on exam it looks reasonable below the knee.  If at all possible we will use in all autogenous bypass.  If not he will require prosthetic bypass.  I have reviewed the indications for the procedure and the potential complications including but not limited to bleeding, wound healing problems, infection including the risk of graft infection, graft thrombosis, and limb loss.  All of his questions were answered and he is agreeable to proceed.  Waverly Ferrari, MD Office: [phone]

## 2020-03-23 ENCOUNTER — Other Ambulatory Visit: Payer: Self-pay

## 2020-03-23 LAB — CBC
HCT: 26.9 % — ABNORMAL LOW (ref 39.0–52.0)
Hemoglobin: 9.1 g/dL — ABNORMAL LOW (ref 13.0–17.0)
MCH: 31.9 pg (ref 26.0–34.0)
MCHC: 33.8 g/dL (ref 30.0–36.0)
MCV: 94.4 fL (ref 80.0–100.0)
Platelets: 147 10*3/uL — ABNORMAL LOW (ref 150–400)
RBC: 2.85 MIL/uL — ABNORMAL LOW (ref 4.22–5.81)
RDW: 14 % (ref 11.5–15.5)
WBC: 7.8 10*3/uL (ref 4.0–10.5)
nRBC: 0 % (ref 0.0–0.2)

## 2020-03-23 LAB — HEPARIN LEVEL (UNFRACTIONATED): Heparin Unfractionated: <0.1 IU/mL — ABNORMAL LOW (ref 0.30–0.70)

## 2020-03-23 MED ORDER — OXYCODONE HCL 5 MG PO TABS: 5.0000 mg | ORAL_TABLET | Freq: Four times a day (QID) | ORAL | 0 refills | Status: DC | PRN

## 2020-03-23 NOTE — Discharge Instructions (Signed)
 [facility] [place]  Discharge instructions  Lower Extremity Bypass Surgery  Please refer to the following instruction for your post-procedure care. Your surgeon or physician assistant will discuss any changes with you.  Activity  You are encouraged to walk as much as you can. You can slowly return to normal activities during the month after your surgery. Avoid strenuous activity and heavy lifting until your doctor tells you it's OK. Avoid activities such as vacuuming or swinging a golf club. Do not drive until your doctor give the OK and you are no longer taking prescription pain medications. It is also normal to have difficulty with sleep habits, eating and bowel movement after surgery. These will go away with time.  Bathing/Showering  You may shower after you go home. Do not soak in a bathtub, hot tub, or swim until the incision heals completely.  Incision Care  Clean your incision with mild soap and water. Shower every day. Pat the area dry with a clean towel. You do not need a bandage unless otherwise instructed. Do not apply any ointments or creams to your incision. If you have open wounds you will be instructed how to care for them or a visiting nurse may be arranged for you. If you have staples or sutures along your incision they will be removed at your post-op appointment. You may have skin glue on your incision. Do not peel it off. It will come off on its own in about one week. If you have a great deal of moisture in your groin, use a gauze help keep this area dry.  Diet  Resume your normal diet. There are no special food restrictions following this procedure. A low fat/ low cholesterol diet is recommended for all patients with vascular disease. In order to heal from your surgery, it is CRITICAL to get adequate nutrition. Your body requires vitamins, minerals, and protein. Vegetables are the best source of vitamins and minerals. Vegetables also provide the  perfect balance of protein. Processed food has little nutritional value, so try to avoid this.  Medications  Resume taking all your medications unless your doctor or nurse practitioner tells you not to. If your incision is causing pain, you may take over-the-counter pain relievers such as acetaminophen (Tylenol). If you were prescribed a stronger pain medication, please aware these medication can cause nausea and constipation. Prevent nausea by taking the medication with a snack or meal. Avoid constipation by drinking plenty of fluids and eating foods with high amount of fiber, such as fruits, vegetables, and grains. Take Colase 100 mg (an over-the-counter stool softener) twice a day as needed for constipation. Do not take Tylenol if you are taking prescription pain medications.  Follow Up  Our office will schedule a follow up appointment 2-3 weeks following discharge.  Please call us immediately for any of the following conditions  Severe or worsening pain in your legs or feet while at rest or while walking Increase pain, redness, warmth, or drainage (pus) from your incision site(s) Fever of 101 degree or higher The swelling in your leg with the bypass suddenly worsens and becomes more painful than when you were in the hospital If you have been instructed to feel your graft pulse then you should do so every day. If you can no longer feel this pulse, call the office immediately. Not all patients are given this instruction.  Leg swelling is common after leg bypass surgery.  The swelling should improve over a few months   following surgery. To improve the swelling, you may elevate your legs above the level of your heart while you are sitting or resting. Your surgeon or physician assistant may ask you to apply an ACE wrap or wear compression (TED) stockings to help to reduce swelling.  Reduce your risk of vascular disease  Stop smoking. If you would like help call [facility] at 1-800-QUIT-NOW  (1-800-784-8669) or [place] at 336-586-4000.  Manage your cholesterol Maintain a desired weight Control your diabetes weight Control your diabetes Keep your blood pressure down  If you have any questions, please call the office at [phone]   

## 2020-03-23 NOTE — Progress Notes (Addendum)
[facility] [place]  Subjective  - Right foot feels better.   Objective 134/68 85 97.9 F (36.6 C) (Oral) 19 100%  Intake/Output Summary (Last 24 hours) at 03/23/2020 0717 Last data filed at 03/23/2020 0600 Gross per 24 hour  Intake 1984 ml  Output 1725 ml  Net 259 ml   Doppler DP/PT B, faint palpable DP right foot. Incisions on right LE healing well, right groin soft without hematoma Lungs non labored breathing   Assessment/Planning: 59 y.o. male is s/p axillary bifemoral bypass graft, followed by right LE fem-pop bypass If pain controlled and able to ambulate in halls he will be discharged home today.  Encouraged mobility alt. With LE elevation for edema. F/U in our office in 2-3 weeks.    Jaime [name] 03/23/2020 7:17 AM --  Laboratory Lab Results: Recent Labs    03/22/20 0401 03/23/20 0527  WBC 3.8* 7.8  HGB 11.4* 9.1*  HCT 32.4* 26.9*  PLT 159 147*   BMET Recent Labs    03/22/20 0401  NA 133*  K 3.3*  CL 103  CO2 23  GLUCOSE 99  BUN <5*  CREATININE 0.49*  CALCIUM 8.2*    COAG Lab Results  Component Value Date   INR 1.1 03/22/2020   INR 1.2 03/20/2020   INR 1.0 03/19/2020   No results found for: PTT  I have interviewed the patient and examined the patient. I agree with the findings by the PA. Agree with plans for discharge today.  Cari Caraway, MD [phone]
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
## 2020-04-13 ENCOUNTER — Other Ambulatory Visit: Payer: Self-pay

## 2020-04-13 ENCOUNTER — Encounter: Payer: Self-pay | Admitting: Vascular Surgery

## 2020-04-13 ENCOUNTER — Ambulatory Visit (INDEPENDENT_AMBULATORY_CARE_PROVIDER_SITE_OTHER): Payer: Medicaid Other | Admitting: Vascular Surgery

## 2020-04-13 VITALS — BP 125/77 | HR 110 | Temp 98.0°F | Resp 20 | Ht 70.0 in | Wt 123.0 lb

## 2020-04-13 DIAGNOSIS — I739 Peripheral vascular disease, unspecified: Secondary | ICD-10-CM

## 2020-04-13 DIAGNOSIS — Z48812 Encounter for surgical aftercare following surgery on the circulatory system: Secondary | ICD-10-CM

## 2020-04-13 NOTE — Progress Notes (Signed)
Patient name: Jaime [name] MRN: [id number] DOB: [birth year] Sex: male  REASON FOR VISIT:   Follow-up after right femoropopliteal bypass.  HPI:   Jaime [name] is a pleasant 59 y.o. male who underwent a right axilla femoral bypass and a right to left femoral-femoral bypass.  He presented with progressive rest pain of the right foot and was admitted to the hospital for pain control.  We elected to proceed with infrainguinal bypass on the right.  On 03/22/2020 he underwent a right femoral to below-knee popliteal artery bypass with a vein graft.  He had brisk Doppler signals in his right foot postop.  He comes in for his first outpatient visit.   Since I saw him in the hospital he has no complaints except that he had some right leg swelling which has resolved.  He has been elevating his leg.  He denies any claudication.  Current Outpatient Medications  Medication Sig Dispense Refill  . aspirin EC 81 MG tablet Take 1 tablet (81 mg total) by mouth daily.    Marland Kitchen atorvastatin (LIPITOR) 10 MG tablet Take 1 tablet (10 mg total) by mouth daily. 30 tablet 11  . Blood Pressure Monitoring (BLOOD PRESSURE KIT) DEVI 1 Device by Does not apply route daily. 1 each 0  . buPROPion (WELLBUTRIN XL) 150 MG 24 hr tablet 1 tab by mouth every morning 30 tablet 11  . lisinopril (ZESTRIL) 5 MG tablet Take 1 tablet by mouth once daily (Patient taking differently: Take 5 mg by mouth daily. ) 30 tablet 11  . omeprazole (PRILOSEC) 20 MG capsule 1 tab by mouth on empty stomach at bedtime. (Patient not taking: Reported on 03/20/2020) 30 capsule 3  . Tea Tree 100 % OIL Mix with Gold Bond Foot cream and massage into thickened skin of feet and nails twice daily especially after shower. (Patient not taking: Reported on 03/20/2020)    . terbinafine (LAMISIL) 250 MG tablet Take 1 tablet (250 mg total) by mouth daily. (Patient not taking: Reported on 04/13/2020) 84 tablet 0   No current facility-administered medications for this visit.     REVIEW OF SYSTEMS:  [X] denotes positive finding, [ ] denotes negative finding Vascular    Leg swelling    Cardiac    Chest pain or chest pressure:    Shortness of breath upon exertion:    Short of breath when lying flat:    Irregular heart rhythm:    Constitutional    Fever or chills:     PHYSICAL EXAM:   Vitals:   04/13/20 1511  BP: 125/77  Pulse: (!) 110  Resp: 20  Temp: 98 F (36.7 C)  SpO2: 99%  Weight: 123 lb (55.8 kg)  Height: 5' 10" (1.778 m)    GENERAL: The patient is a well-nourished male, in no acute distress. The vital signs are documented above. CARDIOVASCULAR: There is a regular rate and rhythm. PULMONARY: There is good air exchange bilaterally without wheezing or rales. VASCULAR: He has a palpable axillofemoral graft pulse, femoral-femoral graft pulse, popliteal pulse, posterior tibial and dorsalis pedis pulse in the right leg. His incisions are healing nicely.  DATA:   No new data  MEDICAL ISSUES:   STATUS POST AXILLOFEMORAL BYPASS AND RIGHT FEMOROPOPLITEAL BYPASS: His bypass grafts are all working well with a palpable pedal pulse on the right.  I have encouraged him to stay as active as possible.  I have ordered follow-up ABIs and graft duplex scans in 3 months.  I will see  him back at that time.  He knows to call sooner if he has problems.  [name] [facility] [place] [phone]

## 2020-04-14 ENCOUNTER — Other Ambulatory Visit: Payer: Self-pay | Admitting: *Deleted

## 2020-04-14 DIAGNOSIS — I70229 Atherosclerosis of native arteries of extremities with rest pain, unspecified extremity: Secondary | ICD-10-CM

## 2020-04-14 DIAGNOSIS — I739 Peripheral vascular disease, unspecified: Secondary | ICD-10-CM

## 2020-04-19 ENCOUNTER — Telehealth: Payer: Self-pay | Admitting: Internal Medicine

## 2020-04-19 NOTE — Telephone Encounter (Signed)
Called patient to confirm 6 weeks toenail f/u with hepatic lab but patient stated is not taking terbinafine (LAMISIL) 250 MG tablet because is not having good legs circulation and does not know if that's ok to takes medication. Patient will like to get a call back with clarification on this matter.

## 2020-04-26 ENCOUNTER — Ambulatory Visit: Payer: Medicaid Other | Admitting: Internal Medicine

## 2020-05-04 ENCOUNTER — Encounter (HOSPITAL_COMMUNITY): Payer: Medicaid Other

## 2020-05-04 ENCOUNTER — Ambulatory Visit: Payer: Medicaid Other | Admitting: Vascular Surgery

## 2020-05-06 NOTE — Telephone Encounter (Signed)
Spoke with patient informed. Yes he can start medication for toenail fungus. Patient verbalized understanding.

## 2020-05-06 NOTE — Telephone Encounter (Signed)
Patient called stating still waiting for a call back to get clarification on  terbinafine.

## 2020-06-01 ENCOUNTER — Ambulatory Visit: Payer: Medicaid Other | Admitting: Vascular Surgery

## 2020-06-01 ENCOUNTER — Encounter (HOSPITAL_COMMUNITY): Payer: Medicaid Other

## 2020-06-07 ENCOUNTER — Ambulatory Visit: Payer: Medicaid Other | Admitting: Internal Medicine

## 2020-06-09 ENCOUNTER — Telehealth: Payer: Self-pay | Admitting: Internal Medicine

## 2020-06-09 NOTE — Telephone Encounter (Signed)
Patient called requesting Rx on atorvastatin (LIPITOR) 10 MG tablet to be called in at [facility] [place].

## 2020-06-10 NOTE — Telephone Encounter (Signed)
Spoke with patient. Informed has refills on medication until November. Patient verbalized understanding. States he will call in a refill. Patient also wanted to inform Dr. Delrae Alfred that he has not started the terbinafine yet due to issues he is having with his circulation.

## 2020-07-12 ENCOUNTER — Other Ambulatory Visit: Payer: Self-pay

## 2020-07-12 ENCOUNTER — Ambulatory Visit (HOSPITAL_COMMUNITY)
Admission: RE | Admit: 2020-07-12 | Discharge: 2020-07-12 | Disposition: A | Discharge status: Home / Self Care | Payer: Medicaid Other | Source: Ambulatory Visit | Attending: Vascular Surgery | Admitting: Vascular Surgery

## 2020-07-12 ENCOUNTER — Ambulatory Visit (INDEPENDENT_AMBULATORY_CARE_PROVIDER_SITE_OTHER)
Admission: RE | Admit: 2020-07-12 | Discharge: 2020-07-12 | Disposition: A | Discharge status: Home / Self Care | Payer: Medicaid Other | Source: Ambulatory Visit | Attending: Vascular Surgery | Admitting: Vascular Surgery

## 2020-07-12 DIAGNOSIS — I739 Peripheral vascular disease, unspecified: Secondary | ICD-10-CM

## 2020-07-12 DIAGNOSIS — I70229 Atherosclerosis of native arteries of extremities with rest pain, unspecified extremity: Secondary | ICD-10-CM

## 2020-07-13 ENCOUNTER — Ambulatory Visit (INDEPENDENT_AMBULATORY_CARE_PROVIDER_SITE_OTHER): Payer: Medicaid Other | Admitting: Vascular Surgery

## 2020-07-13 ENCOUNTER — Ambulatory Visit (INDEPENDENT_AMBULATORY_CARE_PROVIDER_SITE_OTHER)
Admission: RE | Admit: 2020-07-13 | Discharge: 2020-07-13 | Disposition: A | Discharge status: Home / Self Care | Payer: Medicaid Other | Source: Ambulatory Visit | Attending: Vascular Surgery | Admitting: Vascular Surgery

## 2020-07-13 ENCOUNTER — Ambulatory Visit (HOSPITAL_COMMUNITY)
Admission: RE | Admit: 2020-07-13 | Discharge: 2020-07-13 | Disposition: A | Discharge status: Home / Self Care | Payer: Medicaid Other | Source: Ambulatory Visit | Attending: Vascular Surgery | Admitting: Vascular Surgery

## 2020-07-13 ENCOUNTER — Encounter: Payer: Self-pay | Admitting: Vascular Surgery

## 2020-07-13 VITALS — BP 136/72 | HR 95 | Temp 97.9°F | Resp 20 | Ht 70.0 in | Wt 124.4 lb

## 2020-07-13 DIAGNOSIS — I70229 Atherosclerosis of native arteries of extremities with rest pain, unspecified extremity: Secondary | ICD-10-CM

## 2020-07-13 DIAGNOSIS — I739 Peripheral vascular disease, unspecified: Secondary | ICD-10-CM
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
## 2020-07-14 ENCOUNTER — Other Ambulatory Visit: Payer: Self-pay | Admitting: *Deleted

## 2020-07-14 DIAGNOSIS — I739 Peripheral vascular disease, unspecified: Secondary | ICD-10-CM

## 2020-07-14 DIAGNOSIS — I70229 Atherosclerosis of native arteries of extremities with rest pain, unspecified extremity: Secondary | ICD-10-CM

## 2020-07-20 ENCOUNTER — Ambulatory Visit: Payer: Medicaid Other | Admitting: Internal Medicine

## 2020-07-20 ENCOUNTER — Encounter: Payer: Self-pay | Admitting: Internal Medicine

## 2020-07-20 VITALS — BP 130/64 | HR 89 | Resp 12 | Ht 70.0 in | Wt 122.0 lb

## 2020-07-20 DIAGNOSIS — B360 Pityriasis versicolor: Secondary | ICD-10-CM

## 2020-07-20 DIAGNOSIS — B351 Tinea unguium: Secondary | ICD-10-CM

## 2020-07-20 DIAGNOSIS — Z0000 Encounter for general adult medical examination without abnormal findings: Secondary | ICD-10-CM | POA: Diagnosis not present

## 2020-07-20 DIAGNOSIS — R3911 Hesitancy of micturition: Secondary | ICD-10-CM | POA: Diagnosis not present

## 2020-07-20 LAB — POCT URINALYSIS DIPSTICK
Bilirubin, UA: NEGATIVE
Glucose, UA: NEGATIVE
Ketones, UA: NEGATIVE
Nitrite, UA: NEGATIVE
Protein, UA: NEGATIVE
Spec Grav, UA: 1.015 (ref 1.010–1.025)
Urobilinogen, UA: NEGATIVE E.U./dL — AB
pH, UA: 5 (ref 5.0–8.0)

## 2020-07-20 MED ORDER — CIPROFLOXACIN HCL 500 MG PO TABS: 500.0000 mg | ORAL_TABLET | Freq: Two times a day (BID) | ORAL | 0 refills | Status: AC

## 2020-07-20 MED ORDER — TAMSULOSIN HCL 0.4 MG PO CAPS: ORAL_CAPSULE | ORAL | 3 refills | Status: AC

## 2020-07-20 NOTE — Progress Notes (Signed)
Subjective:    Patient ID: Jaime [name], male   DOB: 04-10-1961, 59 y.o.   MRN: [id number]   HPI   Here for medical clearance for oral surgery with IV sedation only.  Has two concerns:  1.  BPH:  Did not go back to Urology as he did not want the procedure to implant a device as he felt the Flomax was working well for him.  He has not been able to get the flomax refilled as he has not been back in over a year to Urology.   States he now feels urge to urinate and only drips.   Has not been able to urinate well for past 2 weeks. States his urine looks cloudy.  No dysuria.    2.  Toenail onychomycosis:  Has Terbinafine, but has not yet started with all of his surgeries in past year.    3.  Oral Surgery planned to shave bone down to prepare for partials to fit appropriately.  No tooth extraction planned with this surgery.  IV sedation planned.   He does take 81 mg ASA daily.   He has a history of PAD with revascularization procedures x 2 this past year:  Right Axillobifemoral bypass graft and right femoral to below knee popliteal artery bypass.  He has been doing well other than numbness and tingling that remains in right foot.    4.  Tobacco use:  continues  Has not had Covid or influenza--will get Moderna after his surgery  Current Meds  Medication Sig  . aspirin EC 81 MG tablet Take 1 tablet (81 mg total) by mouth daily.  Marland Kitchen atorvastatin (LIPITOR) 10 MG tablet Take 1 tablet (10 mg total) by mouth daily.  . Blood Pressure Monitoring (BLOOD PRESSURE KIT) DEVI 1 Device by Does not apply route daily.  Marland Kitchen buPROPion (WELLBUTRIN XL) 150 MG 24 hr tablet 1 tab by mouth every morning  . lisinopril (ZESTRIL) 5 MG tablet Take 1 tablet by mouth once daily (Patient taking differently: Take 5 mg by mouth daily. )  . omeprazole (PRILOSEC) 20 MG capsule 1 tab by mouth on empty stomach at bedtime.  . Tea Tree 100 % OIL Mix with Gold Bond Foot cream and massage into thickened skin of feet and nails twice  daily especially after shower.   No Known Allergies   Review of Systems  Respiratory: Negative for cough and shortness of breath.   Cardiovascular: Positive for palpitations (Rare and short lived.  No associated light headedness.). Negative for chest pain (Very physically active.  Goes for walks.  Still with mild discomfort in legs since revascularization.  Able to walk further than prior to surgeries.  ECG in June normal.) and leg swelling.  Gastrointestinal: Negative for blood in stool (no melena.).  Skin: Color change: (--  Hematological: Does not bruise/bleed easily.      Objective:   BP 130/64 (BP Location: Right Arm, Patient Position: Sitting, Cuff Size: Normal)   Pulse 89   Resp 12   Ht _0  (1.778 m)   Wt 122 lb (55.3 kg)   BMI 17.51 kg/m   Physical Exam  NAD. HEENT:  PERRL, EOMI, TMs pearly gray, throat without injection.  Dental decay and tooth loss. Neck:  Supple, No adenopathy, no thyromegaly Chest:  CTA CV:  RRR with normal S1 and S2, No S3, S4 or murmur.  No carotid bruits.  Carotid, radial and DP pulses normal and equal. Abd:  S, NT, No HSM  or mass, + BS.  Right axillobifem bypass graft with good thrill and bruit throughout (right lateral chest/abdomen and bilateral low pelvic/suprapubic area.  Right foot with good DP and PT pulses with good coloration.  Palpable DP and PT pulses on left foot with good coloration. Skin:  White fine flaky spots about jaw/neck/upper back. Toenails thickened and yellowed   Assessment & Plan  1.  Medical Clearance:  Return for fasting labs on Monday:  FLP, CBC, CMP.  He is medically stable for oral surgery with IV sedation.  Has done well postoperatively for PAD of RLE.  Second surgery was beginning of June 2021. Did discuss completely stopping tobacco use as he will cause further symptomatic atherosclerosis.    Addendum:  Called Dr. Nicole Cella office on 07/25/2020:  Dr. Dede Query holding aspirin for 1 week.  Patient in for  fasting labs today and relates he has stopped smoking as discussed at his visit.  2.  BPH and possible prostatitis:  Cipro 500 mg twice daily for 1 days and restart Tamsulosin.  Urine culture ultimately negative for infection.  3.  HM:  Will return for COVID and influenza vaccines after surgery.  4.  Toenail onychomycosis:  Will start Terbinafine and notify me once he is done with surgery and vaccines as well.  Will need follow up in 6 and 12 weeks after start.  5.  Tinea versicolor:  Wants to hold on treatment for this as well.  Terbinafine unlikely to cover.  Would recommend Ketoconazole.

## 2020-07-22 LAB — URINE CULTURE: Organism ID, Bacteria: NO GROWTH

## 2020-07-25 ENCOUNTER — Telehealth: Payer: Self-pay | Admitting: Internal Medicine

## 2020-07-25 ENCOUNTER — Other Ambulatory Visit: Payer: Medicaid Other

## 2020-07-25 ENCOUNTER — Telehealth: Payer: Self-pay

## 2020-07-25 ENCOUNTER — Other Ambulatory Visit: Payer: Self-pay

## 2020-07-25 DIAGNOSIS — D649 Anemia, unspecified: Secondary | ICD-10-CM

## 2020-07-25 DIAGNOSIS — I739 Peripheral vascular disease, unspecified: Secondary | ICD-10-CM

## 2020-07-25 DIAGNOSIS — Z79899 Other long term (current) drug therapy: Secondary | ICD-10-CM

## 2020-07-25 NOTE — Telephone Encounter (Signed)
Dr. Delrae Alfred called to ask about holding ASA for an oral surgery for this patient. Per Dr. Myra Gianotti, ASA can be held per their standard or for one week. Left VM with provider.

## 2020-07-25 NOTE — Telephone Encounter (Signed)
Pt. Called requesting a blood type test to be added to his blood drawn this morning.  Please advise.

## 2020-07-26 LAB — CBC WITH DIFFERENTIAL/PLATELET
Basophils Absolute: 0.1 10*3/uL (ref 0.0–0.2)
Basos: 1 %
EOS (ABSOLUTE): 0 10*3/uL (ref 0.0–0.4)
Eos: 1 %
Hematocrit: 36.6 % — ABNORMAL LOW (ref 37.5–51.0)
Hemoglobin: 12.3 g/dL — ABNORMAL LOW (ref 13.0–17.7)
Immature Grans (Abs): 0 10*3/uL (ref 0.0–0.1)
Immature Granulocytes: 0 %
Lymphocytes Absolute: 2.2 10*3/uL (ref 0.7–3.1)
Lymphs: 43 %
MCH: 31.9 pg (ref 26.6–33.0)
MCHC: 33.6 g/dL (ref 31.5–35.7)
MCV: 95 fL (ref 79–97)
Monocytes Absolute: 0.5 10*3/uL (ref 0.1–0.9)
Monocytes: 10 %
Neutrophils Absolute: 2.3 10*3/uL (ref 1.4–7.0)
Neutrophils: 45 %
Platelets: 281 10*3/uL (ref 150–450)
RBC: 3.86 x10E6/uL — ABNORMAL LOW (ref 4.14–5.80)
RDW: 11.6 % (ref 11.6–15.4)
WBC: 5.2 10*3/uL (ref 3.4–10.8)

## 2020-07-26 LAB — LIPID PANEL W/O CHOL/HDL RATIO
Cholesterol, Total: 113 mg/dL (ref 100–199)
HDL: 63 mg/dL (ref >39)
LDL Chol Calc (NIH): 38 mg/dL (ref 0–99)
Triglycerides: 47 mg/dL (ref 0–149)
VLDL Cholesterol Cal: 12 mg/dL (ref 5–40)

## 2020-07-26 LAB — COMPREHENSIVE METABOLIC PANEL
ALT: 25 IU/L (ref 0–44)
AST: 31 IU/L (ref 0–40)
Albumin/Globulin Ratio: 1.1 — ABNORMAL LOW (ref 1.2–2.2)
Albumin: 3.7 g/dL — ABNORMAL LOW (ref 3.8–4.9)
Alkaline Phosphatase: 188 IU/L — ABNORMAL HIGH (ref 44–121)
BUN/Creatinine Ratio: 4 — ABNORMAL LOW (ref 9–20)
BUN: 3 mg/dL — ABNORMAL LOW (ref 6–24)
Bilirubin Total: 0.7 mg/dL (ref 0.0–1.2)
CO2: 23 mmol/L (ref 20–29)
Calcium: 9.1 mg/dL (ref 8.7–10.2)
Chloride: 94 mmol/L — ABNORMAL LOW (ref 96–106)
Creatinine, Ser: 0.67 mg/dL — ABNORMAL LOW (ref 0.76–1.27)
GFR calc Af Amer: 122 mL/min/{1.73_m2} (ref >59)
GFR calc non Af Amer: 106 mL/min/{1.73_m2} (ref >59)
Globulin, Total: 3.4 g/dL (ref 1.5–4.5)
Glucose: 103 mg/dL — ABNORMAL HIGH (ref 65–99)
Potassium: 4.9 mmol/L (ref 3.5–5.2)
Sodium: 131 mmol/L — ABNORMAL LOW (ref 134–144)
Total Protein: 7.1 g/dL (ref 6.0–8.5)

## 2020-07-26 NOTE — Telephone Encounter (Signed)
Called patient made aware of his O+ blood type per Dr. Delrae Alfred

## 2020-10-03 ENCOUNTER — Telehealth: Payer: Self-pay | Admitting: Internal Medicine

## 2020-10-03 NOTE — Telephone Encounter (Signed)
Patient called requesting a refill of atorvastatin (LIPITOR) 10 MG tablet. Patient stated that only have 3 pills left and no more refills. He wants to know if he needs to continued taking it or if he needs to stop it. Please advise.

## 2020-10-04 MED ORDER — ATORVASTATIN CALCIUM 10 MG PO TABS: 10.0000 mg | ORAL_TABLET | Freq: Every day | ORAL | 9 refills | Status: AC

## 2020-10-04 NOTE — Addendum Note (Signed)
Addended by: Marcene Duos on: 10/04/2020 05:14 PM   Modules accepted: Orders

## 2020-10-06 NOTE — Telephone Encounter (Signed)
Called patient and left a messages letting him know that his prescription refill was sent.

## 2020-11-26 ENCOUNTER — Encounter (HOSPITAL_COMMUNITY): Payer: Self-pay | Admitting: Emergency Medicine

## 2020-11-26 ENCOUNTER — Other Ambulatory Visit: Payer: Self-pay

## 2020-11-26 ENCOUNTER — Ambulatory Visit (HOSPITAL_COMMUNITY)
Admission: EM | Admit: 2020-11-26 | Discharge: 2020-11-26 | Disposition: A | Discharge status: Home / Self Care | Payer: Medicaid Other

## 2020-11-26 DIAGNOSIS — R197 Diarrhea, unspecified: Secondary | ICD-10-CM | POA: Diagnosis not present

## 2020-11-26 DIAGNOSIS — K219 Gastro-esophageal reflux disease without esophagitis: Secondary | ICD-10-CM

## 2020-11-26 DIAGNOSIS — R609 Edema, unspecified: Secondary | ICD-10-CM

## 2020-11-26 DIAGNOSIS — R059 Cough, unspecified: Secondary | ICD-10-CM

## 2020-11-26 NOTE — Discharge Instructions (Addendum)
No urgent needs today. Try limiting your salt intake which will help with edema, but also keep f/u with your PCP. OK to take Pepcid AC daily for now until you see your PCP to help with reflux. If you like milk can have that at night before bed to help with symptoms. Try adding 1 yogurt to your daily regimen for diarrhea, otherwise will need to see GI for this. If anything worsens, return here or sooner appt with PCP.
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
## 2020-11-26 NOTE — ED Triage Notes (Signed)
Patient c/o productive cough , diarrhea, and bilateral feet swelling x 2 weeks.   Patient endorses cough worsens at night time. Patient endorses "cough makes me want to regurgitate my food".   Patient endorses 3 episodes of diarrhea each day.   Patient endorses he has decreased food intake.   Patient endorses he has tried "home remedies" w/ no relief of symptoms.

## 2020-11-29 ENCOUNTER — Telehealth: Payer: Self-pay | Admitting: Clinical

## 2020-11-30 NOTE — Telephone Encounter (Signed)
Patient was given info to try Coricidin HBP yesterday. If he continues to cough, to let us know. Did not seem to think this might be COVID--due to exposure to dust

## 2020-12-05 ENCOUNTER — Telehealth: Payer: Self-pay | Admitting: Internal Medicine

## 2020-12-20 DEATH — deceased

## 2021-01-17 ENCOUNTER — Ambulatory Visit: Payer: Medicaid Other | Admitting: Internal Medicine

## 2021-07-25 IMAGING — CT CT ANGIO CHEST
2 of 3 series · 15 of 36 positions shown · IV contrast (APPLIED)
Comparison: None.

CLINICAL DATA: Sudden onset of right leg pain. Axillary bifem graft
01/19/2020.

EXAM:
CT ANGIOGRAPHY CHEST WITH CONTRAST
CT ANGIOGRAPHY OF ABDOMINAL AORTA WITH ILIOFEMORAL RUNOFF
TECHNIQUE: Multidetector CT imaging of the chest, abdomen, pelvis and lower
extremities was performed using the standard protocol during bolus
administration of intravenous contrast. Multiplanar CT image
reconstructions and MIPs were obtained to evaluate the vascular
anatomy.
CONTRAST:  100mL OMNIPAQUE IOHEXOL 350 MG/ML SOLN

[Series 503: arterial · axial · arterial · 0.76mm/px · z∈[-24,+1446]mm · 12 of 860 slices shown]
[im 64/860  lung]
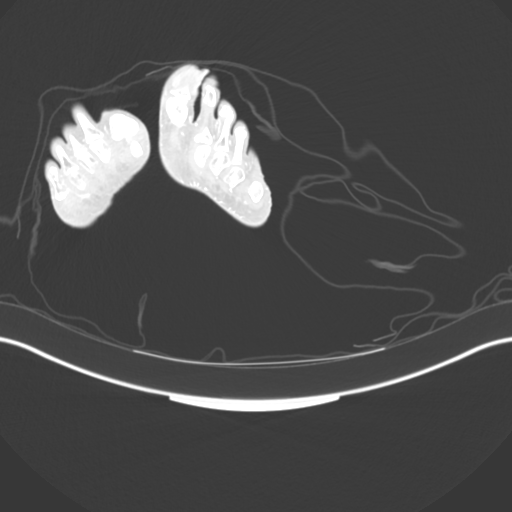
[im 128/860  mediastinal]
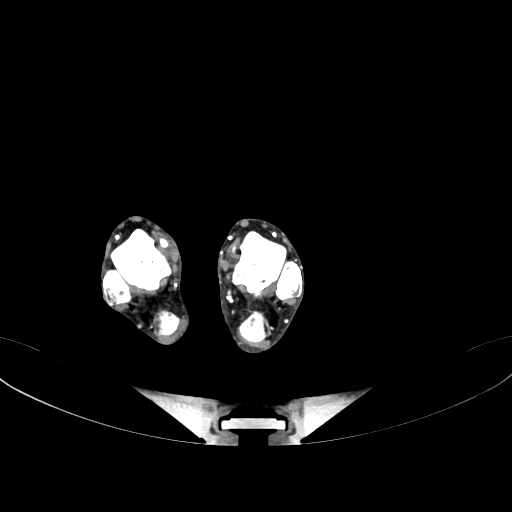
[im 191/860  lung]
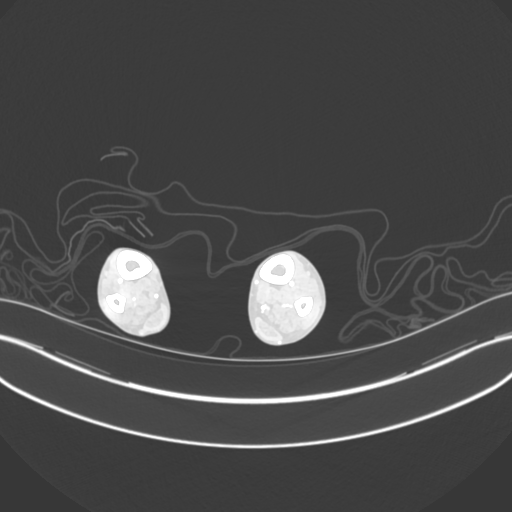
[im 255/860  mediastinal]
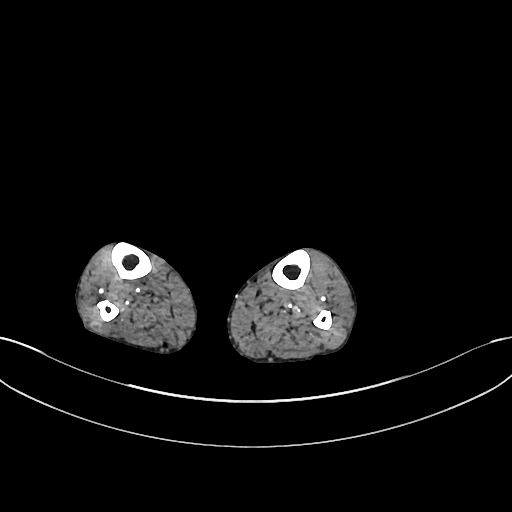
[im 319/860  lung]
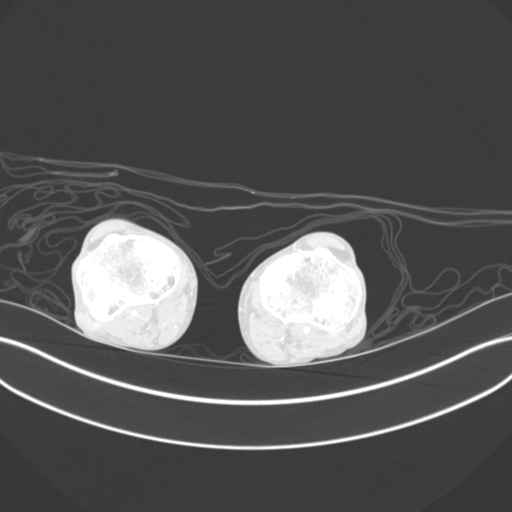
[im 382/860  mediastinal]
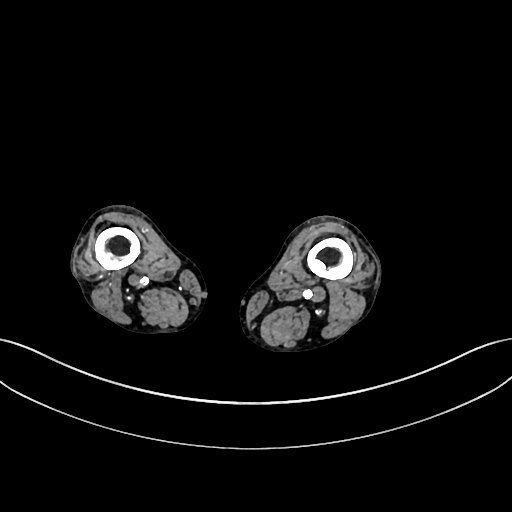
[im 478/860  lung]
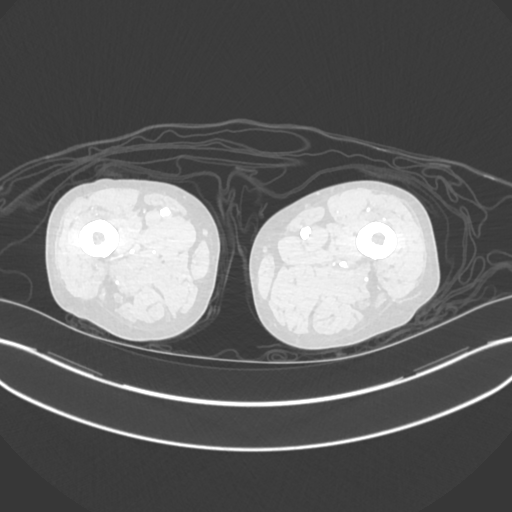
[im 541/860  mediastinal]
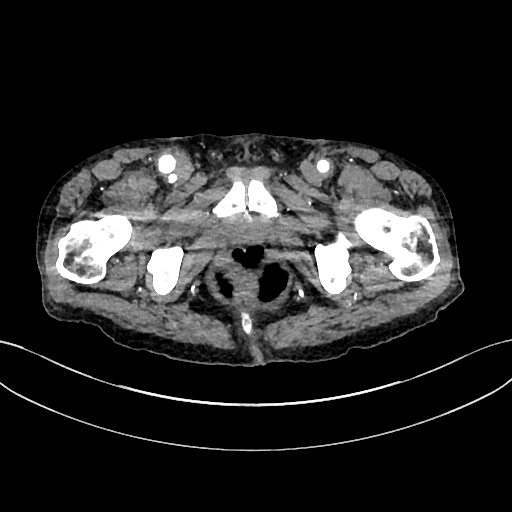
[im 605/860  lung]
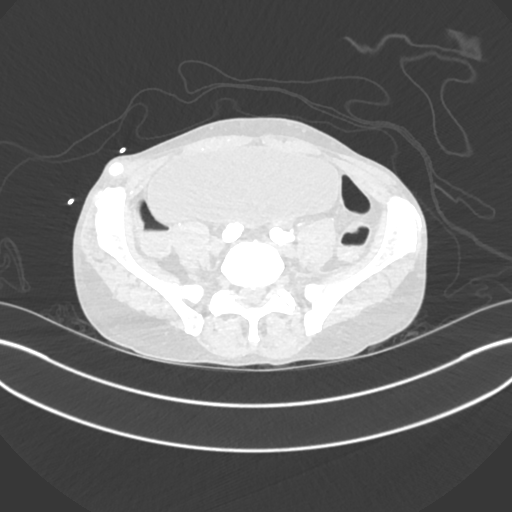
[im 669/860  mediastinal]
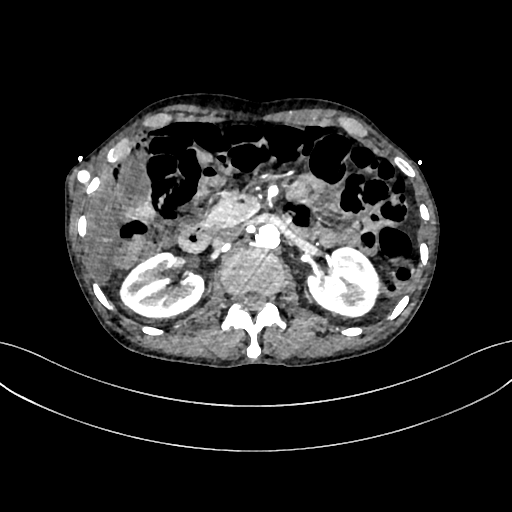
[im 732/860  lung]
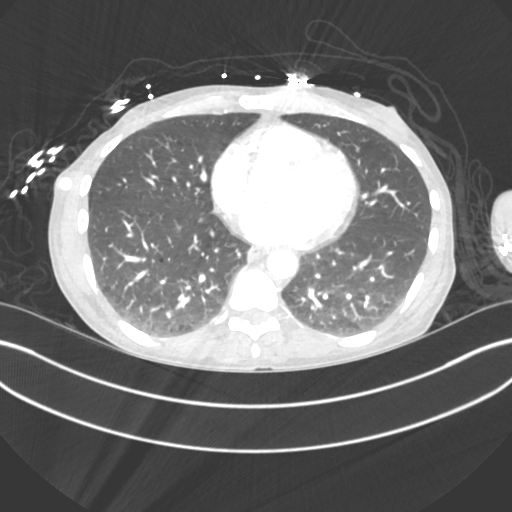
[im 796/860  mediastinal]
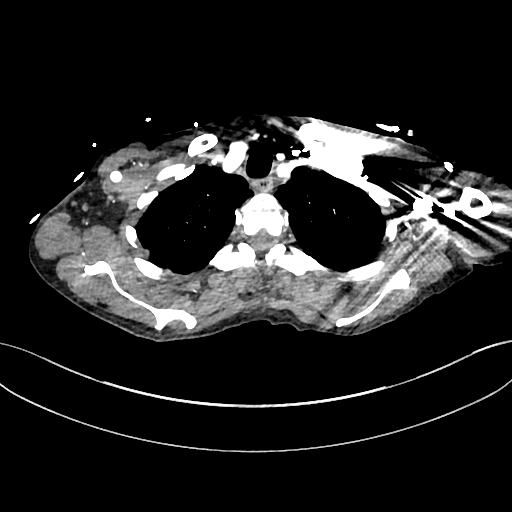

[Series 504: cor abd · coronal · 0.98mm/px · 3 of 171 slices shown]
[im 35/171  mediastinal]
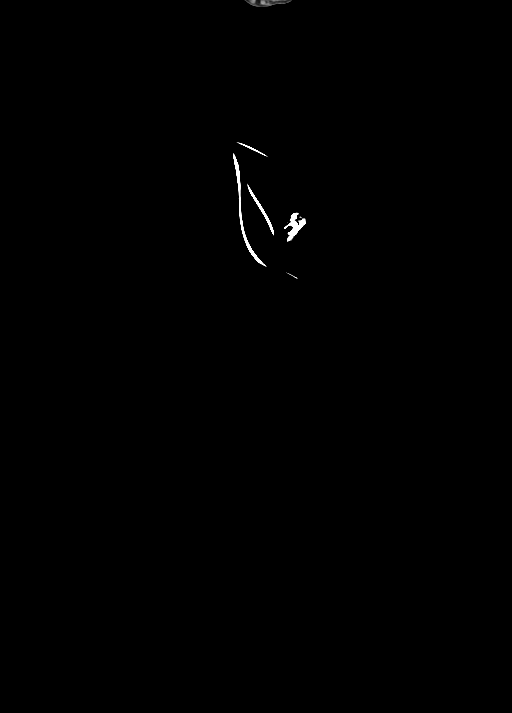
[im 69/171  mediastinal]
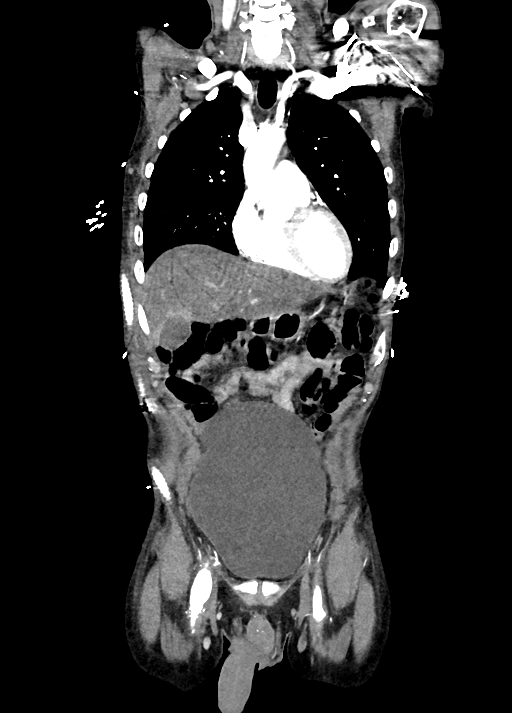
[im 103/171  mediastinal]
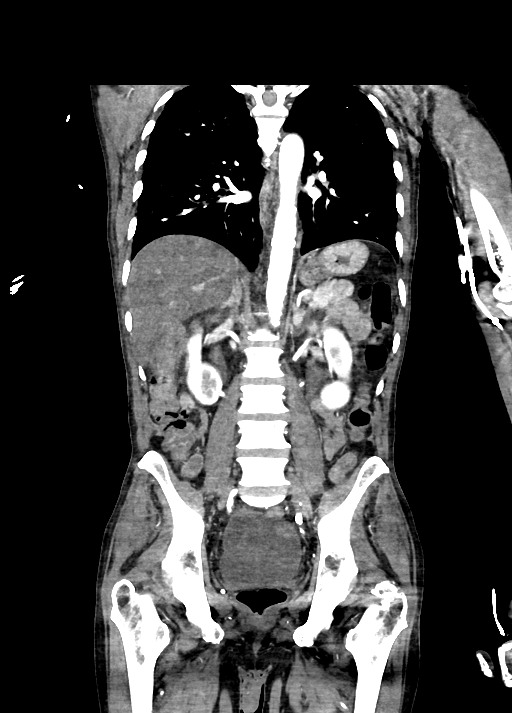

[15 of 36 positions shown; findings below may reference images not displayed]

FINDINGS: Chest CTA:

Vascular: Atherosclerosis of the thoracic aorta. Conventional
branching pattern from the aortic arch. No aortic dissection or
acute aortic syndrome. No aortic aneurysm. Heart is normal in size.
Coronary artery calcifications. No pericardial effusion.

Right axillary bypass graft is widely patent to the level of the
femoral arteries, that will be assessed on concurrent lower
extremity CTA.

Mediastinum: No adenopathy. No esophageal wall thickening. No
visualized thyroid nodule.

Lungs: Moderate emphysema with bronchial thickening. No focal
airspace disease. No evidence of pulmonary mass. No pulmonary edema
or pleural fluid. There is retained mucus within the trachea, right
mainstem bronchus and right lower lobe bronchus.

Musculoskeletal: There are no acute or suspicious osseous
abnormalities.

VASCULAR

Aorta: Moderate to advanced atherosclerosis. No aneurysm. No
dissection or significant stenosis.

Celiac: Common origin of the celiac and SMA trunk. Widely patent
without significant stenosis. Distal branch vessels are patent.

SMA: Common origin of the celiac and SMA trunk. Widely patent
without significant stenosis. Distal branch vessels are patent.

Renals: Accessory bilateral lower pole renal arteries. All renal
arteries are patent without significant stenosis or acute findings.

IMA: Patent.

RIGHT Lower Extremity

Inflow: Densely calcified with high-grade near complete stenosis of
the common iliac artery which is small in caliber. Calcific external
iliac artery but patent to the level of the axilla bypass graft.

Outflow: The anastomosis is prominent. Soft tissue thickening about
the graft at the femoral anastomosis. No evidence of pseudoaneurysm.
Profundus femoral artery is patent. Superficial femoral artery is
heavily calcified, small in caliber with severe stenosis throughout.
Scattered areas of near complete to short segment complete
occlusion. Popliteal artery is patent, densely calcified and small
in caliber.

Runoff: Patent three vessel runoff to the ankle.

LEFT Lower Extremity

Inflow: Common femoral artery is heavily calcified with severe
stenosis near complete exclusion of the level of the bifurcation.
External iliac artery is densely calcified moderately stenosed.
External iliac artery is occluded the level of the acetabulum.

Outflow: Femoral bypass graft is widely patent. The profundus
femoral arteries patent. The superficial femoral artery is heavily
calcified and severely diseased. There is a 2.7 cm length occlusion
in the mid distal superficial femoral artery with distal
reconstitution. Popliteal artery is heavily diseased but patent.

Runoff: Patent three vessel runoff to the ankle.

Veins: Limited assessment on this arterial phase exam.

Review of the MIP images confirms the above findings.

Review of medical records demonstrates vascular surgery aware of the
above findings.

NON-VASCULAR

Hepatobiliary: Probable steatosis, not well assessed arterial phase
imaging. No calcified gallstone or pericholecystic inflammation. No
biliary dilatation.

Pancreas: No ductal dilatation or inflammation.

Spleen: Normal in size and arterial enhancement.

Adrenals/Urinary Tract: Left adrenal thickening. Normal right
adrenal gland. No hydronephrosis or perinephric edema. Urinary
bladder is distended. There is no bladder wall thickening.

Stomach/Bowel: Bowel evaluation is limited given paucity of body fat
and arterial phase technique. There is no evidence of bowel
inflammation or obstruction.

Lymphatic: No bulky adenopathy.

Reproductive: Prostate is unremarkable.

Other: No free fluid or free air.

Musculoskeletal: Degenerative change in the spine. There are no
acute or suspicious osseous abnormalities.
IMPRESSION: 1. Patent right axillary bypass graft to the level of the femoral
arteries.
2. Right superficial femoral artery is diffusely diseased with areas
of severe stenosis and short segment occlusion. There is
three-vessel runoff to the ankle.
3. Left superficial femoral artery is diffusely diseased. There is a
2.7 cm length occlusion in the mid distal left superficial femoral
artery with distal reconstitution. There is three-vessel runoff to
the ankle.
4. Emphysema with bronchial thickening. Retained mucus within the
trachea, right mainstem bronchus, and right lower lobe bronchus.
5. No acute intra-abdominal/pelvic findings.

Aortic Atherosclerosis (VALX1-DE8.8) and Emphysema (VALX1-3Y4.8).

## 2021-07-25 IMAGING — CT CT ANGIO AOBIFEM WO/W CM
1 of 7 series · 5 of 16 positions shown, 7 images · IV contrast (APPLIED)
Comparison: None.

CLINICAL DATA: Sudden onset of right leg pain. Axillary bifem graft
01/19/2020.

EXAM:
CT ANGIOGRAPHY CHEST WITH CONTRAST
CT ANGIOGRAPHY OF ABDOMINAL AORTA WITH ILIOFEMORAL RUNOFF
TECHNIQUE: Multidetector CT imaging of the chest, abdomen, pelvis and lower
extremities was performed using the standard protocol during bolus
administration of intravenous contrast. Multiplanar CT image
reconstructions and MIPs were obtained to evaluate the vascular
anatomy.
CONTRAST:  100mL OMNIPAQUE IOHEXOL 350 MG/ML SOLN

[Series 5: arterial · axial · arterial · 0.76mm/px · z∈[+138,+1286]mm · 5 of 860 slices shown, 7 images]
[im 144/860  soft-tissue]
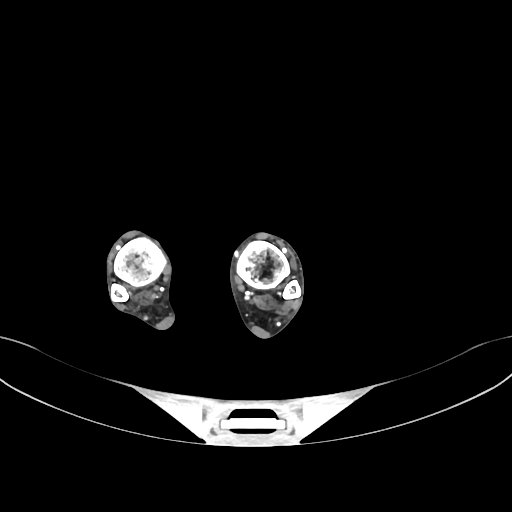
[im 144/860  bone]
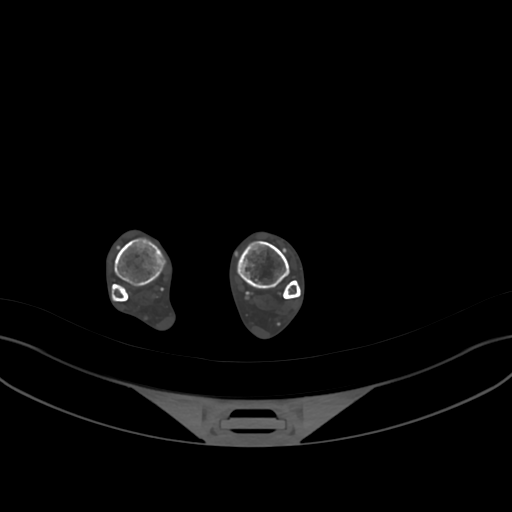
[im 287/860  soft-tissue]
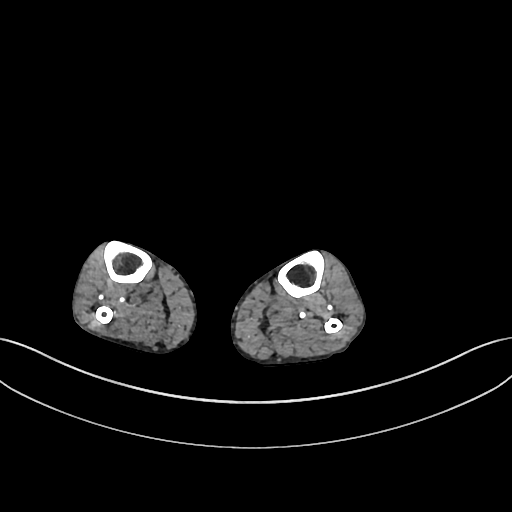
[im 430/860  soft-tissue]
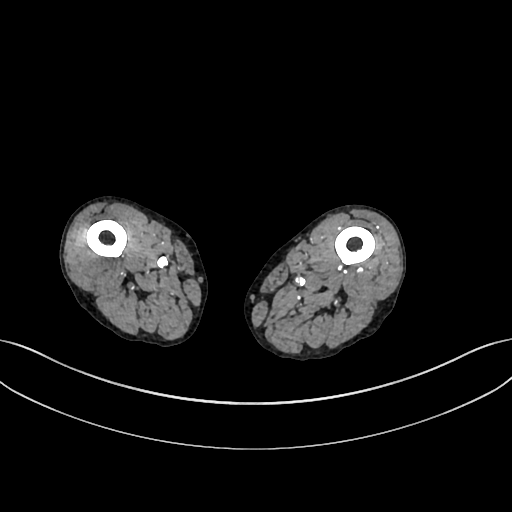
[im 573/860  soft-tissue]
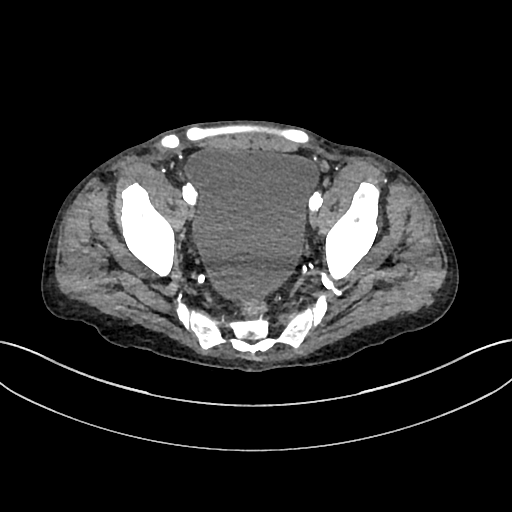
[im 716/860  soft-tissue]
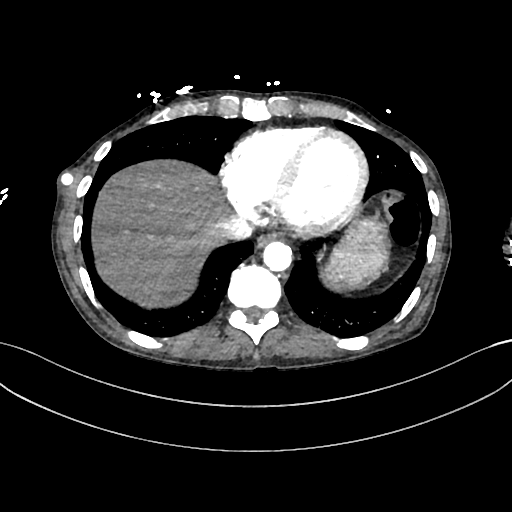
[im 716/860  bone]
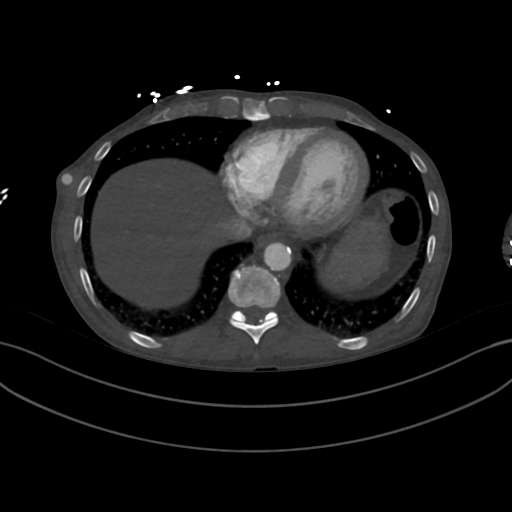

[5 of 16 positions shown; findings below may reference images not displayed]

FINDINGS: Chest CTA:

Vascular: Atherosclerosis of the thoracic aorta. Conventional
branching pattern from the aortic arch. No aortic dissection or
acute aortic syndrome. No aortic aneurysm. Heart is normal in size.
Coronary artery calcifications. No pericardial effusion.

Right axillary bypass graft is widely patent to the level of the
femoral arteries, that will be assessed on concurrent lower
extremity CTA.

Mediastinum: No adenopathy. No esophageal wall thickening. No
visualized thyroid nodule.

Lungs: Moderate emphysema with bronchial thickening. No focal
airspace disease. No evidence of pulmonary mass. No pulmonary edema
or pleural fluid. There is retained mucus within the trachea, right
mainstem bronchus and right lower lobe bronchus.

Musculoskeletal: There are no acute or suspicious osseous
abnormalities.

VASCULAR

Aorta: Moderate to advanced atherosclerosis. No aneurysm. No
dissection or significant stenosis.

Celiac: Common origin of the celiac and SMA trunk. Widely patent
without significant stenosis. Distal branch vessels are patent.

SMA: Common origin of the celiac and SMA trunk. Widely patent
without significant stenosis. Distal branch vessels are patent.

Renals: Accessory bilateral lower pole renal arteries. All renal
arteries are patent without significant stenosis or acute findings.

IMA: Patent.

RIGHT Lower Extremity

Inflow: Densely calcified with high-grade near complete stenosis of
the common iliac artery which is small in caliber. Calcific external
iliac artery but patent to the level of the axilla bypass graft.

Outflow: The anastomosis is prominent. Soft tissue thickening about
the graft at the femoral anastomosis. No evidence of pseudoaneurysm.
Profundus femoral artery is patent. Superficial femoral artery is
heavily calcified, small in caliber with severe stenosis throughout.
Scattered areas of near complete to short segment complete
occlusion. Popliteal artery is patent, densely calcified and small
in caliber.

Runoff: Patent three vessel runoff to the ankle.

LEFT Lower Extremity

Inflow: Common femoral artery is heavily calcified with severe
stenosis near complete exclusion of the level of the bifurcation.
External iliac artery is densely calcified moderately stenosed.
External iliac artery is occluded the level of the acetabulum.

Outflow: Femoral bypass graft is widely patent. The profundus
femoral arteries patent. The superficial femoral artery is heavily
calcified and severely diseased. There is a 2.7 cm length occlusion
in the mid distal superficial femoral artery with distal
reconstitution. Popliteal artery is heavily diseased but patent.

Runoff: Patent three vessel runoff to the ankle.

Veins: Limited assessment on this arterial phase exam.

Review of the MIP images confirms the above findings.

Review of medical records demonstrates vascular surgery aware of the
above findings.

NON-VASCULAR

Hepatobiliary: Probable steatosis, not well assessed arterial phase
imaging. No calcified gallstone or pericholecystic inflammation. No
biliary dilatation.

Pancreas: No ductal dilatation or inflammation.

Spleen: Normal in size and arterial enhancement.

Adrenals/Urinary Tract: Left adrenal thickening. Normal right
adrenal gland. No hydronephrosis or perinephric edema. Urinary
bladder is distended. There is no bladder wall thickening.

Stomach/Bowel: Bowel evaluation is limited given paucity of body fat
and arterial phase technique. There is no evidence of bowel
inflammation or obstruction.

Lymphatic: No bulky adenopathy.

Reproductive: Prostate is unremarkable.

Other: No free fluid or free air.

Musculoskeletal: Degenerative change in the spine. There are no
acute or suspicious osseous abnormalities.
IMPRESSION: 1. Patent right axillary bypass graft to the level of the femoral
arteries.
2. Right superficial femoral artery is diffusely diseased with areas
of severe stenosis and short segment occlusion. There is
three-vessel runoff to the ankle.
3. Left superficial femoral artery is diffusely diseased. There is a
2.7 cm length occlusion in the mid distal left superficial femoral
artery with distal reconstitution. There is three-vessel runoff to
the ankle.
4. Emphysema with bronchial thickening. Retained mucus within the
trachea, right mainstem bronchus, and right lower lobe bronchus.
5. No acute intra-abdominal/pelvic findings.

Aortic Atherosclerosis (VALX1-DE8.8) and Emphysema (VALX1-3Y4.8).
